# Patient Record
Sex: Male | Born: 1965 | Race: White | Hispanic: No | Marital: Single
Health system: Southern US, Community
[De-identification: ages and names within clinical notes are randomized; demographics above are authoritative.]

## PROBLEM LIST (undated history)

## (undated) DIAGNOSIS — I10 Essential (primary) hypertension: Secondary | ICD-10-CM

## (undated) DIAGNOSIS — S82091A Other fracture of right patella, initial encounter for closed fracture: Secondary | ICD-10-CM

## (undated) DIAGNOSIS — F341 Dysthymic disorder: Secondary | ICD-10-CM

## (undated) HISTORY — DX: Dysthymic disorder: F34.1

## (undated) HISTORY — PX: MANDIBLE FRACTURE SURGERY: SHX706

## (undated) HISTORY — PX: TOE SURGERY: SHX1073

## (undated) HISTORY — DX: Essential (primary) hypertension: I10

---

## 2006-11-22 ENCOUNTER — Ambulatory Visit: Payer: Self-pay | Admitting: Family Medicine

## 2006-12-22 ENCOUNTER — Ambulatory Visit: Payer: Self-pay | Admitting: Family Medicine

## 2007-04-23 ENCOUNTER — Ambulatory Visit: Payer: Self-pay | Admitting: Family Medicine

## 2007-05-14 ENCOUNTER — Ambulatory Visit: Payer: Self-pay | Admitting: Family Medicine

## 2007-05-17 ENCOUNTER — Ambulatory Visit: Payer: Self-pay | Admitting: Internal Medicine

## 2007-05-17 ENCOUNTER — Encounter (INDEPENDENT_AMBULATORY_CARE_PROVIDER_SITE_OTHER): Payer: Self-pay | Admitting: Nurse Practitioner

## 2007-05-17 LAB — CONVERTED CEMR LAB
ALT: 25 units/L (ref 0–53)
Alkaline Phosphatase: 75 units/L (ref 39–117)
Basophils Absolute: 0 10*3/uL (ref 0.0–0.1)
Creatinine, Ser: 1 mg/dL (ref 0.40–1.50)
Eosinophils Absolute: 0.2 10*3/uL (ref 0.0–0.7)
Eosinophils Relative: 2 % (ref 0–5)
Glucose, Bld: 92 mg/dL (ref 70–99)
HCT: 47 % (ref 39.0–52.0)
MCHC: 34.5 g/dL (ref 30.0–36.0)
MCV: 89 fL (ref 78.0–100.0)
Monocytes Absolute: 1 10*3/uL — ABNORMAL HIGH (ref 0.2–0.7)
Platelets: 246 10*3/uL (ref 150–400)
RDW: 13.6 % (ref 11.5–14.0)
Sodium: 139 meq/L (ref 135–145)
Total Bilirubin: 0.6 mg/dL (ref 0.3–1.2)
Total Protein: 7.4 g/dL (ref 6.0–8.3)

## 2007-05-23 ENCOUNTER — Ambulatory Visit: Payer: Self-pay | Admitting: Family Medicine

## 2007-05-23 ENCOUNTER — Encounter (INDEPENDENT_AMBULATORY_CARE_PROVIDER_SITE_OTHER): Payer: Self-pay | Admitting: Nurse Practitioner

## 2007-05-23 LAB — CONVERTED CEMR LAB
HDL: 39 mg/dL — ABNORMAL LOW (ref >39)
Total CHOL/HDL Ratio: 5.4
Triglycerides: 163 mg/dL — ABNORMAL HIGH (ref <150)

## 2007-06-05 ENCOUNTER — Ambulatory Visit: Payer: Self-pay | Admitting: Family Medicine

## 2007-06-12 ENCOUNTER — Ambulatory Visit (HOSPITAL_COMMUNITY): Admission: RE | Admit: 2007-06-12 | Discharge: 2007-06-12 | Payer: Self-pay | Admitting: Nurse Practitioner

## 2007-06-15 ENCOUNTER — Ambulatory Visit: Payer: Self-pay | Admitting: *Deleted

## 2007-11-28 ENCOUNTER — Ambulatory Visit: Payer: Self-pay | Admitting: Family Medicine

## 2007-12-07 ENCOUNTER — Ambulatory Visit: Payer: Self-pay | Admitting: Family Medicine

## 2008-01-04 ENCOUNTER — Ambulatory Visit: Payer: Self-pay | Admitting: Family Medicine

## 2008-02-29 ENCOUNTER — Emergency Department (HOSPITAL_COMMUNITY): Admission: EM | Admit: 2008-02-29 | Discharge: 2008-02-29 | Payer: Self-pay | Admitting: Emergency Medicine

## 2008-03-07 ENCOUNTER — Ambulatory Visit: Payer: Self-pay | Admitting: Family Medicine

## 2009-07-08 ENCOUNTER — Ambulatory Visit: Payer: Self-pay | Admitting: Family Medicine

## 2009-08-06 ENCOUNTER — Ambulatory Visit: Payer: Self-pay | Admitting: Family Medicine

## 2010-01-08 ENCOUNTER — Ambulatory Visit: Payer: Self-pay | Admitting: Family Medicine

## 2010-01-12 ENCOUNTER — Ambulatory Visit: Payer: Self-pay | Admitting: Family Medicine

## 2010-07-28 ENCOUNTER — Ambulatory Visit: Payer: Self-pay | Admitting: Family Medicine

## 2010-08-30 ENCOUNTER — Ambulatory Visit: Payer: Self-pay | Admitting: Family Medicine

## 2011-03-23 ENCOUNTER — Other Ambulatory Visit: Payer: Self-pay | Admitting: Family Medicine

## 2011-03-24 ENCOUNTER — Other Ambulatory Visit: Payer: Self-pay | Admitting: Family Medicine

## 2011-03-24 MED ORDER — PAROXETINE HCL 20 MG PO TABS: 20.0000 mg | ORAL_TABLET | Freq: Every day | ORAL | Status: DC

## 2011-03-28 NOTE — Telephone Encounter (Signed)
Renew the medicine 

## 2011-03-28 NOTE — Telephone Encounter (Signed)
Have you done this?

## 2011-07-25 ENCOUNTER — Encounter: Payer: Self-pay | Admitting: Family Medicine

## 2011-07-25 ENCOUNTER — Ambulatory Visit (INDEPENDENT_AMBULATORY_CARE_PROVIDER_SITE_OTHER): Payer: Medicaid Other | Admitting: Family Medicine

## 2011-07-25 VITALS — BP 130/80 | HR 68 | Wt 188.0 lb

## 2011-07-25 DIAGNOSIS — M7711 Lateral epicondylitis, right elbow: Secondary | ICD-10-CM

## 2011-07-25 DIAGNOSIS — I10 Essential (primary) hypertension: Secondary | ICD-10-CM

## 2011-07-25 DIAGNOSIS — F329 Major depressive disorder, single episode, unspecified: Secondary | ICD-10-CM

## 2011-07-25 DIAGNOSIS — F3289 Other specified depressive episodes: Secondary | ICD-10-CM

## 2011-07-25 DIAGNOSIS — F32A Depression, unspecified: Secondary | ICD-10-CM

## 2011-07-25 DIAGNOSIS — M7710 Lateral epicondylitis, unspecified elbow: Secondary | ICD-10-CM

## 2011-07-25 DIAGNOSIS — Z23 Encounter for immunization: Secondary | ICD-10-CM

## 2011-07-25 MED ORDER — PAROXETINE HCL 20 MG PO TABS: 20.0000 mg | ORAL_TABLET | ORAL | Status: DC
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
## 2011-07-25 NOTE — Patient Instructions (Signed)
Use heat on your elbow and change her wrist position

## 2011-08-21 ENCOUNTER — Other Ambulatory Visit: Payer: Self-pay | Admitting: Family Medicine

## 2011-09-23 ENCOUNTER — Telehealth: Payer: Self-pay | Admitting: Family Medicine

## 2011-09-23 NOTE — Telephone Encounter (Signed)
I CALLED THE PHARMACY AND THE PATIENT DOES HAVE 5 REFILLS LEFT ON HIS PAXIL. I CALLED THE PATIENT AND INFORMED HIM OF THIS INFORMATION. CLS

## 2011-09-23 NOTE — Telephone Encounter (Signed)
Chart shows it was refilled early October with 6 refills per Dr. Susann Givens.  Call pharmacy to verify

## 2012-01-23 ENCOUNTER — Telehealth: Payer: Self-pay | Admitting: Internal Medicine

## 2012-01-23 MED ORDER — LISINOPRIL-HYDROCHLOROTHIAZIDE 10-12.5 MG PO TABS: 1.0000 | ORAL_TABLET | Freq: Every day | ORAL | Status: DC

## 2012-01-23 NOTE — Telephone Encounter (Signed)
Med sent in.

## 2012-05-16 ENCOUNTER — Encounter: Payer: Self-pay | Admitting: Medical

## 2012-05-16 ENCOUNTER — Ambulatory Visit (INDEPENDENT_AMBULATORY_CARE_PROVIDER_SITE_OTHER): Payer: Medicaid Other | Admitting: Medical

## 2012-05-16 VITALS — BP 140/90 | HR 76 | Temp 97.3°F | Resp 16 | Wt 174.0 lb

## 2012-05-16 DIAGNOSIS — F419 Anxiety disorder, unspecified: Secondary | ICD-10-CM

## 2012-05-16 DIAGNOSIS — F411 Generalized anxiety disorder: Secondary | ICD-10-CM

## 2012-05-16 MED ORDER — PAROXETINE HCL 30 MG PO TABS: 30.0000 mg | ORAL_TABLET | ORAL | Status: DC

## 2012-05-16 NOTE — Patient Instructions (Signed)

## 2012-05-16 NOTE — Progress Notes (Signed)
Subjective: Here for anxiety problem.  He reports that he gets nervous in the evening like something is going to happen, although nothing happens to him.  He notes that there is a lot of activity at the house in the evening.  He lives with 4 other adults, roommates.  Its not a group home, but he has lived with one roommate for 18 years.  He notes in the evenings others in the house slam doors, people raising their voice, making noise, and this gets the dogs barking, making even more noise.  This causes him to be on edge, gets nervousness, irritable.  This interferes with him resting and getting to sleep in the evenings.  He sometimes smokes a little marijuana which helps .  He has tried a friend's Valium which really helped calm him down.   He takes Paxil 20mg  daily in the mornings, but he wants medication to calm him down in the evening.   One of his roommates drinks heavily and this irritates him.   He denies any problems with anxiety when in large crowds or out at stores or during the day.  No other c/o.  Objective: Gen: wd, wn, nad Psych: pleasant, good eye contact  Assessment:  Encounter Diagnosis  Name Primary?  Marland Kitchen Anxiety Yes   Discussed his concerns, home situation, ways to deal with stress and anxiety.   Advised he talk to roommates about not slamming doors or making as much noise in the evenings.  Increased his Paxil to 30mg  daily.   Advised against Valium or other sedative type medication at this time.  Recheck 3-4 wk.

## 2012-05-23 ENCOUNTER — Encounter: Payer: Self-pay | Admitting: Internal Medicine

## 2012-05-24 ENCOUNTER — Telehealth: Payer: Self-pay | Admitting: Family Medicine

## 2012-05-24 MED ORDER — LISINOPRIL-HYDROCHLOROTHIAZIDE 10-12.5 MG PO TABS: 1.0000 | ORAL_TABLET | Freq: Every day | ORAL | Status: DC

## 2012-05-24 NOTE — Telephone Encounter (Signed)
sent B/P meds in

## 2012-06-04 ENCOUNTER — Ambulatory Visit (INDEPENDENT_AMBULATORY_CARE_PROVIDER_SITE_OTHER): Payer: Medicaid Other | Admitting: Family Medicine

## 2012-06-04 ENCOUNTER — Encounter: Payer: Self-pay | Admitting: Family Medicine

## 2012-06-04 VITALS — BP 130/86 | HR 58 | Ht 63.0 in | Wt 173.0 lb

## 2012-06-04 DIAGNOSIS — F341 Dysthymic disorder: Secondary | ICD-10-CM

## 2012-06-04 MED ORDER — PAROXETINE HCL 40 MG PO TABS: 40.0000 mg | ORAL_TABLET | ORAL | Status: DC

## 2012-06-04 NOTE — Progress Notes (Signed)
  Subjective:    Patient ID: Stephen Dudley, male    DOB: 06/01/1966, 46 y.o.   MRN: [id number]  HPI He is here for recheck. His Paxil was increased to 30 mg. He states that he is doing much better. He is having less difficulty with mood swings, sleeping better and generally happier. He states that he is not back to his normal self.   Review of Systems     Objective:   Physical Exam Alert and in no distress with appropriate affect       Assessment & Plan:   1. Dysthymia  PARoxetine (PAXIL) 40 MG tablet   I will increase his Paxil to 40 mg. Recheck here in one month.

## 2012-07-09 ENCOUNTER — Ambulatory Visit (INDEPENDENT_AMBULATORY_CARE_PROVIDER_SITE_OTHER): Payer: Medicaid Other | Admitting: Family Medicine

## 2012-07-09 ENCOUNTER — Encounter: Payer: Self-pay | Admitting: Family Medicine

## 2012-07-09 VITALS — BP 120/80 | HR 62 | Wt 175.0 lb

## 2012-07-09 DIAGNOSIS — F329 Major depressive disorder, single episode, unspecified: Secondary | ICD-10-CM

## 2012-07-09 DIAGNOSIS — Z23 Encounter for immunization: Secondary | ICD-10-CM

## 2012-07-09 DIAGNOSIS — F3289 Other specified depressive episodes: Secondary | ICD-10-CM

## 2012-07-09 DIAGNOSIS — F341 Dysthymic disorder: Secondary | ICD-10-CM

## 2012-07-09 DIAGNOSIS — F32A Depression, unspecified: Secondary | ICD-10-CM

## 2012-07-09 MED ORDER — PAROXETINE HCL 40 MG PO TABS: 40.0000 mg | ORAL_TABLET | ORAL | Status: DC

## 2012-07-09 NOTE — Progress Notes (Signed)
  Subjective:    Patient ID: Stephen Dudley, male    DOB: [birth year], 46 y.o.   MRN: [id number]  HPI He is here for recheck. He states that he is more calm and sleeping better. He is having no other troubles with this medication.   Review of Systems     Objective:   Physical Exam Alert and in no distress with appropriate affect       Assessment & Plan:   1. Depression    continue present medication regimen and recheck here in 6 months. Flu shot given. Risks versus benefits were discussed.

## 2012-09-25 ENCOUNTER — Other Ambulatory Visit: Payer: Self-pay | Admitting: Family Medicine

## 2012-10-03 ENCOUNTER — Other Ambulatory Visit: Payer: Self-pay | Admitting: Family Medicine

## 2013-04-09 ENCOUNTER — Other Ambulatory Visit: Payer: Self-pay | Admitting: Family Medicine

## 2013-07-21 ENCOUNTER — Other Ambulatory Visit: Payer: Self-pay | Admitting: Family Medicine

## 2013-07-22 NOTE — Telephone Encounter (Signed)
Is this ok?

## 2013-07-22 NOTE — Telephone Encounter (Signed)
Needs an appointment but did not let her run out.

## 2013-07-26 ENCOUNTER — Other Ambulatory Visit: Payer: Self-pay | Admitting: Family Medicine

## 2013-07-26 ENCOUNTER — Telehealth: Payer: Self-pay | Admitting: Family Medicine

## 2013-07-26 MED ORDER — LISINOPRIL-HYDROCHLOROTHIAZIDE 10-12.5 MG PO TABS: ORAL_TABLET | ORAL | Status: DC

## 2013-07-26 NOTE — Telephone Encounter (Signed)
Please fill pt's bp meds. He is completely out. Pt uses [facility] in [place].

## 2013-07-26 NOTE — Telephone Encounter (Signed)
SENT IN B/P MEDS NEEDS MED CHECK

## 2013-09-20 ENCOUNTER — Other Ambulatory Visit: Payer: Self-pay | Admitting: Family Medicine

## 2013-10-20 ENCOUNTER — Other Ambulatory Visit: Payer: Self-pay | Admitting: Family Medicine

## 2014-01-11 ENCOUNTER — Other Ambulatory Visit: Payer: Self-pay | Admitting: Family Medicine

## 2014-01-13 NOTE — Telephone Encounter (Signed)
He needs a followup visit before I can renew this. It looks like he ran out several months ago

## 2014-01-13 NOTE — Telephone Encounter (Signed)
Is this ok to refill?  

## 2014-01-14 NOTE — Telephone Encounter (Signed)
Pt states he will CB to schedule follow up

## 2014-06-16 ENCOUNTER — Ambulatory Visit (INDEPENDENT_AMBULATORY_CARE_PROVIDER_SITE_OTHER): Payer: Medicaid Other | Admitting: Family Medicine

## 2014-06-16 ENCOUNTER — Encounter: Payer: Self-pay | Admitting: Family Medicine

## 2014-06-16 VITALS — BP 110/76 | HR 67 | Wt 168.0 lb

## 2014-06-16 DIAGNOSIS — Z79899 Other long term (current) drug therapy: Secondary | ICD-10-CM

## 2014-06-16 DIAGNOSIS — I10 Essential (primary) hypertension: Secondary | ICD-10-CM

## 2014-06-16 DIAGNOSIS — Z1211 Encounter for screening for malignant neoplasm of colon: Secondary | ICD-10-CM

## 2014-06-16 DIAGNOSIS — F32A Depression, unspecified: Secondary | ICD-10-CM

## 2014-06-16 DIAGNOSIS — F329 Major depressive disorder, single episode, unspecified: Secondary | ICD-10-CM

## 2014-06-16 DIAGNOSIS — Z23 Encounter for immunization: Secondary | ICD-10-CM

## 2014-06-16 DIAGNOSIS — F3289 Other specified depressive episodes: Secondary | ICD-10-CM

## 2014-06-16 LAB — LIPID PANEL
CHOLESTEROL: 203 mg/dL — AB (ref 0–200)
HDL: 46 mg/dL (ref >39)
LDL Cholesterol: 138 mg/dL — ABNORMAL HIGH (ref 0–99)
TRIGLYCERIDES: 97 mg/dL (ref <150)
Total CHOL/HDL Ratio: 4.4 Ratio
VLDL: 19 mg/dL (ref 0–40)

## 2014-06-16 LAB — CBC WITH DIFFERENTIAL/PLATELET
Basophils Absolute: 0 10*3/uL (ref 0.0–0.1)
Basophils Relative: 0 % (ref 0–1)
Eosinophils Absolute: 0.3 10*3/uL (ref 0.0–0.7)
Eosinophils Relative: 5 % (ref 0–5)
HCT: 42 % (ref 39.0–52.0)
HEMOGLOBIN: 14.8 g/dL (ref 13.0–17.0)
LYMPHS ABS: 1.7 10*3/uL (ref 0.7–4.0)
Lymphocytes Relative: 24 % (ref 12–46)
MCH: 30.3 pg (ref 26.0–34.0)
MCHC: 35.2 g/dL (ref 30.0–36.0)
MCV: 86.1 fL (ref 78.0–100.0)
MONOS PCT: 11 % (ref 3–12)
Monocytes Absolute: 0.8 10*3/uL (ref 0.1–1.0)
NEUTROS ABS: 4.1 10*3/uL (ref 1.7–7.7)
NEUTROS PCT: 60 % (ref 43–77)
Platelets: 235 10*3/uL (ref 150–400)
RBC: 4.88 MIL/uL (ref 4.22–5.81)
RDW: 13.5 % (ref 11.5–15.5)
WBC: 6.9 10*3/uL (ref 4.0–10.5)

## 2014-06-16 LAB — COMPREHENSIVE METABOLIC PANEL
ALBUMIN: 4.6 g/dL (ref 3.5–5.2)
ALT: 17 U/L (ref 0–53)
AST: 27 U/L (ref 0–37)
Alkaline Phosphatase: 61 U/L (ref 39–117)
BUN: 12 mg/dL (ref 6–23)
CALCIUM: 9.2 mg/dL (ref 8.4–10.5)
CO2: 24 meq/L (ref 19–32)
Chloride: 101 mEq/L (ref 96–112)
Creat: 0.91 mg/dL (ref 0.50–1.35)
GLUCOSE: 125 mg/dL — AB (ref 70–99)
POTASSIUM: 4 meq/L (ref 3.5–5.3)
SODIUM: 135 meq/L (ref 135–145)
TOTAL PROTEIN: 6.8 g/dL (ref 6.0–8.3)
Total Bilirubin: 0.5 mg/dL (ref 0.2–1.2)

## 2014-06-16 MED ORDER — PAROXETINE HCL 40 MG PO TABS: ORAL_TABLET | ORAL | Status: DC

## 2014-06-16 MED ORDER — LISINOPRIL-HYDROCHLOROTHIAZIDE 10-12.5 MG PO TABS: 1.0000 | ORAL_TABLET | Freq: Every day | ORAL | Status: DC

## 2014-06-16 NOTE — Progress Notes (Signed)
   Subjective:    Patient ID: Stephen Dudley, male    DOB: [birth year], 48 y.o.   MRN: [id number]  HPI He is here for medication check. He continues on his blood pressure medication and is having no difficulty with that. He also is on 40 mg of Paxil. He has on occasion forgotten dosing and did run into withdrawal symptoms. He now knows to not skip dosing. He is now 48. He has qualified for Medicaid. We have not done any formal evaluation however there is no question that his mental capacities are diminished. He has no concerns or complaints.   Review of Systems     Objective:   Physical Exam alert and in no distress. Tympanic membranes and canals are normal. Throat is clear. Tonsils are normal. Neck is supple without adenopathy or thyromegaly. Cardiac exam shows a regular sinus rhythm without murmurs or gallops. Lungs are clear to auscultation. Abdominal exam shows no masses or tenderness.        Assessment & Plan:  Need for prophylactic vaccination and inoculation against influenza - Plan: Flu Vaccine QUAD 36+ mos IM  Special screening for malignant neoplasms, colon - Plan: Ambulatory referral to Gastroenterology  Essential hypertension - Plan: CBC with Differential, Comprehensive metabolic panel, lisinopril-hydrochlorothiazide (PRINZIDE,ZESTORETIC) 10-12.5 MG per tablet  Depression - Plan: PARoxetine (PAXIL) 40 MG tablet  Encounter for long-term (current) use of other medications - Plan: CBC with Differential, Comprehensive metabolic panel, Lipid panel, lisinopril-hydrochlorothiazide (PRINZIDE,ZESTORETIC) 10-12.5 MG per tablet His immunizations were updated. I will continue on his present medication regimen and set him up for colonoscopy.

## 2014-06-19 ENCOUNTER — Encounter: Payer: Self-pay | Admitting: Internal Medicine

## 2014-08-27 ENCOUNTER — Encounter: Payer: Medicaid Other | Admitting: Internal Medicine

## 2014-11-17 ENCOUNTER — Other Ambulatory Visit: Payer: Self-pay | Admitting: Family Medicine

## 2014-11-18 ENCOUNTER — Other Ambulatory Visit: Payer: Self-pay | Admitting: Family Medicine

## 2015-07-07 ENCOUNTER — Other Ambulatory Visit: Payer: Self-pay | Admitting: Family Medicine

## 2015-07-08 NOTE — Telephone Encounter (Signed)
Is this okay?

## 2015-07-08 NOTE — Telephone Encounter (Signed)
It's time for an appointment for him. Give him enough medicine until his appointment

## 2015-07-23 ENCOUNTER — Ambulatory Visit (INDEPENDENT_AMBULATORY_CARE_PROVIDER_SITE_OTHER): Payer: Medicaid Other | Admitting: Family Medicine

## 2015-07-23 ENCOUNTER — Encounter: Payer: Self-pay | Admitting: Family Medicine

## 2015-07-23 VITALS — BP 132/94 | HR 64 | Resp 12 | Wt 172.4 lb

## 2015-07-23 DIAGNOSIS — S40011A Contusion of right shoulder, initial encounter: Secondary | ICD-10-CM | POA: Diagnosis not present

## 2015-07-23 DIAGNOSIS — Z1211 Encounter for screening for malignant neoplasm of colon: Secondary | ICD-10-CM

## 2015-07-23 DIAGNOSIS — F329 Major depressive disorder, single episode, unspecified: Secondary | ICD-10-CM | POA: Diagnosis not present

## 2015-07-23 DIAGNOSIS — Z23 Encounter for immunization: Secondary | ICD-10-CM

## 2015-07-23 DIAGNOSIS — I10 Essential (primary) hypertension: Secondary | ICD-10-CM

## 2015-07-23 DIAGNOSIS — F32A Depression, unspecified: Secondary | ICD-10-CM

## 2015-07-23 MED ORDER — LISINOPRIL-HYDROCHLOROTHIAZIDE 10-12.5 MG PO TABS: 1.0000 | ORAL_TABLET | Freq: Every day | ORAL | Status: DC

## 2015-07-23 MED ORDER — PAROXETINE HCL 40 MG PO TABS: 40.0000 mg | ORAL_TABLET | Freq: Every evening | ORAL | Status: DC
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
## 2015-09-18 ENCOUNTER — Other Ambulatory Visit: Payer: Self-pay | Admitting: Family Medicine

## 2015-09-21 NOTE — Telephone Encounter (Signed)
Is this okay?

## 2016-03-15 ENCOUNTER — Encounter: Payer: Self-pay | Admitting: Family Medicine

## 2016-03-15 ENCOUNTER — Ambulatory Visit (INDEPENDENT_AMBULATORY_CARE_PROVIDER_SITE_OTHER): Payer: Medicaid Other | Admitting: Family Medicine

## 2016-03-15 DIAGNOSIS — W57XXXA Bitten or stung by nonvenomous insect and other nonvenomous arthropods, initial encounter: Secondary | ICD-10-CM

## 2016-03-15 DIAGNOSIS — T148 Other injury of unspecified body region: Secondary | ICD-10-CM

## 2016-03-15 DIAGNOSIS — L03818 Cellulitis of other sites: Secondary | ICD-10-CM | POA: Diagnosis not present

## 2016-03-15 MED ORDER — DOXYCYCLINE HYCLATE 100 MG PO TABS: 100.0000 mg | ORAL_TABLET | Freq: Two times a day (BID) | ORAL | Status: DC

## 2016-03-15 NOTE — Progress Notes (Signed)
Subjective:     Patient ID: Stephen Dudley, male   DOB: 02/23/1966, 50 y.o.   MRN: [id number]  HPI Stephen Dudley presents to clinic with painful swelling on his penis after a tick bite in that area 2 days ago.  He removed a small, non-engorged tick a few hours after going fishing in the area on Sunday and the swelling and constant throbbing / burning pain started yesterday.  He denies fever, chills, night sweats, muscle pain, joint pain, rash in any other area, dysuria, hematuria, and change in urinary frequency.    Review of Systems     Objective:   Physical Exam Alert and in no distress.  Cardiac exam shows a regular sinus rhythm without murmurs or gallops. Lungs are clear to auscultation. Swollen, erythematous region on head  of the penis.  No inguinal LAD.       Assessment / Plan:     Tick bite  Cellulitis of other specified site - Plan: doxycycline (VIBRA-TABS) 100 MG tablet  Tick was removed within hours and he has no systemic symptoms therefore I do not think this is a tic related infection.  Will treat with 7 day course of doxycycline 100 mg BID for cellulitis.

## 2016-07-25 ENCOUNTER — Other Ambulatory Visit: Payer: Self-pay | Admitting: Family Medicine

## 2016-07-25 DIAGNOSIS — F329 Major depressive disorder, single episode, unspecified: Secondary | ICD-10-CM

## 2016-07-25 DIAGNOSIS — F32A Depression, unspecified: Secondary | ICD-10-CM

## 2016-07-25 NOTE — Telephone Encounter (Signed)
Is this okay to refill? 

## 2016-09-30 ENCOUNTER — Other Ambulatory Visit: Payer: Self-pay | Admitting: Family Medicine

## 2016-09-30 DIAGNOSIS — I10 Essential (primary) hypertension: Secondary | ICD-10-CM

## 2016-09-30 NOTE — Telephone Encounter (Signed)
Pt coming in on Thursday 12/14 for med check. Has enough med til then

## 2016-10-06 ENCOUNTER — Other Ambulatory Visit: Payer: Self-pay

## 2016-10-06 ENCOUNTER — Encounter: Payer: Self-pay | Admitting: Family Medicine

## 2016-10-06 ENCOUNTER — Ambulatory Visit (INDEPENDENT_AMBULATORY_CARE_PROVIDER_SITE_OTHER): Payer: Medicaid Other | Admitting: Family Medicine

## 2016-10-06 VITALS — BP 146/100 | HR 85 | Ht 63.0 in | Wt 167.0 lb

## 2016-10-06 DIAGNOSIS — F32A Depression, unspecified: Secondary | ICD-10-CM

## 2016-10-06 DIAGNOSIS — T50905A Adverse effect of unspecified drugs, medicaments and biological substances, initial encounter: Secondary | ICD-10-CM

## 2016-10-06 DIAGNOSIS — Z79899 Other long term (current) drug therapy: Secondary | ICD-10-CM

## 2016-10-06 DIAGNOSIS — F17200 Nicotine dependence, unspecified, uncomplicated: Secondary | ICD-10-CM | POA: Diagnosis not present

## 2016-10-06 DIAGNOSIS — Z23 Encounter for immunization: Secondary | ICD-10-CM | POA: Diagnosis not present

## 2016-10-06 DIAGNOSIS — T887XXA Unspecified adverse effect of drug or medicament, initial encounter: Secondary | ICD-10-CM

## 2016-10-06 DIAGNOSIS — Z1159 Encounter for screening for other viral diseases: Secondary | ICD-10-CM | POA: Diagnosis not present

## 2016-10-06 DIAGNOSIS — I10 Essential (primary) hypertension: Secondary | ICD-10-CM | POA: Diagnosis not present

## 2016-10-06 DIAGNOSIS — F329 Major depressive disorder, single episode, unspecified: Secondary | ICD-10-CM

## 2016-10-06 DIAGNOSIS — Z1211 Encounter for screening for malignant neoplasm of colon: Secondary | ICD-10-CM

## 2016-10-06 LAB — CBC WITH DIFFERENTIAL/PLATELET
BASOS PCT: 0 %
Basophils Absolute: 0 cells/uL (ref 0–200)
EOS PCT: 2 %
Eosinophils Absolute: 154 cells/uL (ref 15–500)
HCT: 46 % (ref 38.5–50.0)
HEMOGLOBIN: 15.5 g/dL (ref 13.2–17.1)
LYMPHS ABS: 1617 {cells}/uL (ref 850–3900)
Lymphocytes Relative: 21 %
MCH: 30.3 pg (ref 27.0–33.0)
MCHC: 33.7 g/dL (ref 32.0–36.0)
MCV: 89.8 fL (ref 80.0–100.0)
MONOS PCT: 10 %
MPV: 10.4 fL (ref 7.5–12.5)
Monocytes Absolute: 770 cells/uL (ref 200–950)
NEUTROS ABS: 5159 {cells}/uL (ref 1500–7800)
Neutrophils Relative %: 67 %
PLATELETS: 247 10*3/uL (ref 140–400)
RBC: 5.12 MIL/uL (ref 4.20–5.80)
RDW: 14 % (ref 11.0–15.0)
WBC: 7.7 10*3/uL (ref 4.0–10.5)

## 2016-10-06 LAB — COMPREHENSIVE METABOLIC PANEL
ALBUMIN: 4.3 g/dL (ref 3.6–5.1)
ALT: 16 U/L (ref 9–46)
AST: 20 U/L (ref 10–35)
Alkaline Phosphatase: 49 U/L (ref 40–115)
BILIRUBIN TOTAL: 0.5 mg/dL (ref 0.2–1.2)
BUN: 14 mg/dL (ref 7–25)
CHLORIDE: 107 mmol/L (ref 98–110)
CO2: 29 mmol/L (ref 20–31)
Calcium: 9.1 mg/dL (ref 8.6–10.3)
Creat: 0.8 mg/dL (ref 0.70–1.33)
Glucose, Bld: 94 mg/dL (ref 65–99)
Potassium: 4.6 mmol/L (ref 3.5–5.3)
SODIUM: 140 mmol/L (ref 135–146)
TOTAL PROTEIN: 6.5 g/dL (ref 6.1–8.1)

## 2016-10-06 LAB — LIPID PANEL
CHOL/HDL RATIO: 3.7 ratio (ref <5.0)
CHOLESTEROL: 182 mg/dL (ref <200)
HDL: 49 mg/dL (ref >40)
LDL Cholesterol: 116 mg/dL — ABNORMAL HIGH (ref <100)
Triglycerides: 85 mg/dL (ref <150)
VLDL: 17 mg/dL (ref <30)

## 2016-10-06 MED ORDER — PAROXETINE HCL 40 MG PO TABS: 40.0000 mg | ORAL_TABLET | Freq: Every evening | ORAL | 3 refills | Status: DC

## 2016-10-06 MED ORDER — LISINOPRIL-HYDROCHLOROTHIAZIDE 20-12.5 MG PO TABS: 1.0000 | ORAL_TABLET | Freq: Every day | ORAL | 3 refills | Status: DC

## 2016-10-06 NOTE — Progress Notes (Signed)
   Subjective:    Patient ID: Stephen RiggerDavid L Dudley, male    DOB: 03/06/1966, 50 y.o.   MRN: [id number]  HPI He is here for an interval evaluation. He continues on Paxil but does note that for the last several weeks he has had increased difficulty with intermittent dizziness, malaise, myalgia, headaches, vision. Prior to this he did get a refill on his Paxil however in the last 3 days he has stopped the medication. He continues on his lisinopril/HCTZ. He is supposed to get a colonoscopy but has not set that up yet. He continues to smoke He has no other concerns or complaints.   Review of Systems     Objective:   Physical Exam Alert and in no distress. Tympanic membranes and canals are normal. Pharyngeal area is normal. Neck is supple without adenopathy or thyromegaly. Cardiac exam shows a regular sinus rhythm without murmurs or gallops. Lungs are clear to auscultation.        Assessment & Plan:  Screening for colon cancer  Essential hypertension - Plan: lisinopril-hydrochlorothiazide (ZESTORETIC) 20-12.5 MG tablet, CBC with Differential/Platelet, Comprehensive metabolic panel  Depression, unspecified depression type - Plan: DISCONTINUED: PARoxetine (PAXIL) 40 MG tablet  Need for hepatitis C screening test - Plan: Hepatitis C antibody  Need for prophylactic vaccination and inoculation against influenza - Plan: Flu Vaccine QUAD 36+ mos IM  Need for prophylactic vaccination with combined diphtheria-tetanus-pertussis (DTP) vaccine - Plan: Tdap vaccine greater than or equal to 7yo IM  Encounter for long-term (current) use of medications - Plan: CBC with Differential/Platelet, Comprehensive metabolic panel, Lipid panel  Adverse effect of drug, initial encounter - Plan: CBC with Differential/Platelet, Comprehensive metabolic panel, Lipid panel His symptoms are most consistent with possible drug withdrawal however discussion with the pharmacy indicates the pill came from the same manufacturer. Did  recommend that they switch to a different brand as this seems to be the most likely cause of his present symptoms. I will also increase his Zestoretic to 20/12.5. He is to return here in one month for recheck on that. His immunizations were updated. He is not interested in quitting smoking.

## 2016-10-06 NOTE — Telephone Encounter (Signed)
I have sent in for name brand only due to manf. And side affects gen. Was giving him

## 2016-10-07 ENCOUNTER — Encounter: Payer: Self-pay | Admitting: Internal Medicine

## 2016-10-07 LAB — HEPATITIS C ANTIBODY: HCV AB: NEGATIVE

## 2016-10-24 ENCOUNTER — Telehealth: Payer: Self-pay | Admitting: Family Medicine

## 2016-10-24 NOTE — Telephone Encounter (Signed)
Recv'd fax from pharmacy that Medicaid will not cover brand name PAXIL

## 2016-11-04 NOTE — Telephone Encounter (Signed)
Called Medicaid t# [phone] and initiated P.A. For brand name Paxil, under clinical review and will have to call back on Monday to see if approved

## 2016-11-07 NOTE — Telephone Encounter (Signed)
See if they will allow us to give the generic of Paxil and if so do it

## 2016-11-07 NOTE — Telephone Encounter (Signed)
P.A. [place] Rampenied for Brand Paxil until pt has trial of either Citalopram, Escitalopram, Sertraline, Fluoxetine, Fluvoxamine.  Do you want to switch?

## 2016-11-08 NOTE — Telephone Encounter (Signed)
Let him know situation and we will go from there. The only other issue would be to switch which means he'll need to come in so we can discuss that

## 2016-11-08 NOTE — Telephone Encounter (Addendum)
Yes generic Paxil is covered, but per notes, issue was that pt was having side effects from generic and wanted to be on Brand and insurance will not cover brand Paxil.  So listed is the covered alternatives

## 2016-11-11 ENCOUNTER — Ambulatory Visit: Payer: Medicaid Other | Admitting: Family Medicine
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
## 2016-11-15 ENCOUNTER — Ambulatory Visit (INDEPENDENT_AMBULATORY_CARE_PROVIDER_SITE_OTHER): Payer: Medicaid Other | Admitting: Family Medicine

## 2016-11-15 ENCOUNTER — Encounter: Payer: Self-pay | Admitting: Family Medicine

## 2016-11-15 VITALS — BP 100/70 | HR 70 | Wt 171.0 lb

## 2016-11-15 DIAGNOSIS — F329 Major depressive disorder, single episode, unspecified: Secondary | ICD-10-CM

## 2016-11-15 DIAGNOSIS — I951 Orthostatic hypotension: Secondary | ICD-10-CM | POA: Diagnosis not present

## 2016-11-15 DIAGNOSIS — F32A Depression, unspecified: Secondary | ICD-10-CM

## 2016-11-15 MED ORDER — PAROXETINE HCL 40 MG PO TABS: 40.0000 mg | ORAL_TABLET | Freq: Every evening | ORAL | 3 refills | Status: DC

## 2016-11-15 NOTE — Progress Notes (Signed)
   Subjective:    Patient ID: Stephen RiggerDavid L Dudley, male    DOB: 11/26/1965, 51 y.o.   MRN: [id number]  HPI He is having trouble getting branded Paxil. His insurance will not pay for that. He would like to try different drugstore to see if he can get a generic product that works better. He also describes one episode of getting up quickly and becoming dizzy and falling.   Review of Systems     Objective:   Physical Exam Alert and in no distress otherwise not examined. His affect is appropriate.       Assessment & Plan:  Depression, unspecified depression type - Plan: PARoxetine (PAXIL) 40 MG tablet  Postural hypotension I will call in generic Paxil to a different drugstore. Also explained that he is describing postural hypotension and encouraged him to get up more slowly from lying or sitting to standing.

## 2016-11-16 ENCOUNTER — Telehealth: Payer: Self-pay | Admitting: Family Medicine

## 2016-11-16 DIAGNOSIS — F329 Major depressive disorder, single episode, unspecified: Secondary | ICD-10-CM

## 2016-11-16 DIAGNOSIS — F32A Depression, unspecified: Secondary | ICD-10-CM

## 2016-11-16 MED ORDER — PAROXETINE HCL 40 MG PO TABS: 40.0000 mg | ORAL_TABLET | Freq: Every evening | ORAL | 3 refills | Status: DC

## 2016-11-16 NOTE — Telephone Encounter (Signed)
Please call [facility] pharmacy does not take medicaid so he needs rx generic prilosec [facility] [address]

## 2017-12-30 ENCOUNTER — Other Ambulatory Visit: Payer: Self-pay | Admitting: Family Medicine

## 2017-12-30 DIAGNOSIS — I10 Essential (primary) hypertension: Secondary | ICD-10-CM

## 2018-01-01 ENCOUNTER — Telehealth: Payer: Self-pay

## 2018-01-01 NOTE — Telephone Encounter (Signed)
Called pt to schedule a med check. No answer and pt cell phone is off will try home number.

## 2018-02-04 ENCOUNTER — Other Ambulatory Visit: Payer: Self-pay | Admitting: Family Medicine

## 2018-02-04 DIAGNOSIS — I10 Essential (primary) hypertension: Secondary | ICD-10-CM

## 2018-03-05 ENCOUNTER — Encounter: Payer: Self-pay | Admitting: Family Medicine

## 2018-03-15 ENCOUNTER — Encounter: Payer: Self-pay | Admitting: Family Medicine

## 2018-03-20 ENCOUNTER — Other Ambulatory Visit: Payer: Self-pay | Admitting: Family Medicine

## 2018-03-20 ENCOUNTER — Other Ambulatory Visit: Payer: Self-pay

## 2018-03-20 ENCOUNTER — Telehealth: Payer: Self-pay | Admitting: Family Medicine

## 2018-03-20 DIAGNOSIS — I10 Essential (primary) hypertension: Secondary | ICD-10-CM

## 2018-03-20 DIAGNOSIS — F329 Major depressive disorder, single episode, unspecified: Secondary | ICD-10-CM

## 2018-03-20 DIAGNOSIS — F32A Depression, unspecified: Secondary | ICD-10-CM

## 2018-03-20 MED ORDER — LISINOPRIL-HYDROCHLOROTHIAZIDE 20-12.5 MG PO TABS: 1.0000 | ORAL_TABLET | Freq: Every day | ORAL | 0 refills | Status: DC

## 2018-03-20 MED ORDER — PAROXETINE HCL 40 MG PO TABS
40.0000 mg | ORAL_TABLET | Freq: Every evening | ORAL | 3 refills | Status: DC
Start: 2018-03-20 — End: 2018-04-19

## 2018-03-20 NOTE — Telephone Encounter (Signed)
Stephen Dudley called and is requesting a refill for the RX's Paxil and Lisinopril would like them sent to the [id number] - [place], [place] - [address] AT [facility] OF [address] pt has a medcheck  appt 04/19/2018

## 2018-04-09 ENCOUNTER — Telehealth: Payer: Self-pay | Admitting: Family Medicine

## 2018-04-09 NOTE — Telephone Encounter (Signed)
[facility]'d [facility] from [facility] that Brand Paxil needs P.A., called pharmacy and they already got approval and processed as generic and pt picked up already

## 2018-04-19 ENCOUNTER — Ambulatory Visit: Payer: Medicaid Other | Admitting: Family Medicine

## 2018-04-19 ENCOUNTER — Encounter: Payer: Self-pay | Admitting: Family Medicine

## 2018-04-19 VITALS — BP 128/86 | HR 66 | Temp 98.0°F | Wt 159.6 lb

## 2018-04-19 DIAGNOSIS — F329 Major depressive disorder, single episode, unspecified: Secondary | ICD-10-CM | POA: Diagnosis not present

## 2018-04-19 DIAGNOSIS — F32A Depression, unspecified: Secondary | ICD-10-CM

## 2018-04-19 DIAGNOSIS — Z1211 Encounter for screening for malignant neoplasm of colon: Secondary | ICD-10-CM | POA: Diagnosis not present

## 2018-04-19 DIAGNOSIS — F17200 Nicotine dependence, unspecified, uncomplicated: Secondary | ICD-10-CM

## 2018-04-19 DIAGNOSIS — I10 Essential (primary) hypertension: Secondary | ICD-10-CM

## 2018-04-19 DIAGNOSIS — Z13220 Encounter for screening for lipoid disorders: Secondary | ICD-10-CM

## 2018-04-19 LAB — COMPREHENSIVE METABOLIC PANEL
A/G RATIO: 1.8 (ref 1.2–2.2)
ALT: 20 IU/L (ref 0–44)
AST: 25 IU/L (ref 0–40)
Albumin: 4.4 g/dL (ref 3.5–5.5)
Alkaline Phosphatase: 85 IU/L (ref 39–117)
BUN/Creatinine Ratio: 11 (ref 9–20)
BUN: 10 mg/dL (ref 6–24)
Bilirubin Total: 0.3 mg/dL (ref 0.0–1.2)
CO2: 24 mmol/L (ref 20–29)
CREATININE: 0.87 mg/dL (ref 0.76–1.27)
Calcium: 9 mg/dL (ref 8.7–10.2)
Chloride: 103 mmol/L (ref 96–106)
GFR, EST AFRICAN AMERICAN: 114 mL/min/{1.73_m2} (ref >59)
GFR, EST NON AFRICAN AMERICAN: 99 mL/min/{1.73_m2} (ref >59)
GLUCOSE: 88 mg/dL (ref 65–99)
Globulin, Total: 2.4 g/dL (ref 1.5–4.5)
POTASSIUM: 4.7 mmol/L (ref 3.5–5.2)
Sodium: 141 mmol/L (ref 134–144)
Total Protein: 6.8 g/dL (ref 6.0–8.5)

## 2018-04-19 LAB — LIPID PANEL
CHOL/HDL RATIO: 3.9 ratio (ref 0.0–5.0)
Cholesterol, Total: 193 mg/dL (ref 100–199)
HDL: 49 mg/dL (ref >39)
LDL Calculated: 132 mg/dL — ABNORMAL HIGH (ref 0–99)
TRIGLYCERIDES: 62 mg/dL (ref 0–149)
VLDL CHOLESTEROL CAL: 12 mg/dL (ref 5–40)

## 2018-04-19 LAB — CBC WITH DIFFERENTIAL/PLATELET
BASOS ABS: 0 10*3/uL (ref 0.0–0.2)
Basos: 0 %
EOS (ABSOLUTE): 0.2 10*3/uL (ref 0.0–0.4)
Eos: 2 %
HEMOGLOBIN: 15.1 g/dL (ref 13.0–17.7)
Hematocrit: 44.8 % (ref 37.5–51.0)
Immature Grans (Abs): 0 10*3/uL (ref 0.0–0.1)
Immature Granulocytes: 0 %
LYMPHS ABS: 1.8 10*3/uL (ref 0.7–3.1)
Lymphs: 23 %
MCH: 30.5 pg (ref 26.6–33.0)
MCHC: 33.7 g/dL (ref 31.5–35.7)
MCV: 91 fL (ref 79–97)
MONOCYTES: 10 %
MONOS ABS: 0.8 10*3/uL (ref 0.1–0.9)
NEUTROS ABS: 5.2 10*3/uL (ref 1.4–7.0)
Neutrophils: 65 %
Platelets: 238 10*3/uL (ref 150–450)
RBC: 4.95 x10E6/uL (ref 4.14–5.80)
RDW: 14.1 % (ref 12.3–15.4)
WBC: 8 10*3/uL (ref 3.4–10.8)

## 2018-04-19 MED ORDER — LISINOPRIL-HYDROCHLOROTHIAZIDE 20-12.5 MG PO TABS: 1.0000 | ORAL_TABLET | Freq: Every day | ORAL | 3 refills | Status: DC

## 2018-04-19 MED ORDER — PAROXETINE HCL 40 MG PO TABS: 40.0000 mg | ORAL_TABLET | Freq: Every evening | ORAL | 3 refills | Status: DC

## 2018-04-19 NOTE — Progress Notes (Signed)
   Subjective:    Patient ID: Stephen RiggerDavid L Dudley, male    DOB: 01/01/1966, 52 y.o.   MRN: [id number]  HPI He is here for an interval evaluation.  He continues to smoke roughly 5 cigarettes/day and is not interested in quitting.  Continues on lisinopril for his blood pressure.  He also is taking Paxil and has been on this and quite stable psychologically for quite some time.  Recently his roommate did die.  He seems to be handling this well and is grieving appropriately.  He has no other concerns or questions.   Review of Systems     Objective:   Physical Exam Alert and in no distress. Tympanic membranes and canals are normal. Pharyngeal area is normal. Neck is supple without adenopathy or thyromegaly. Cardiac exam shows a regular sinus rhythm without murmurs or gallops. Lungs are clear to auscultation.        Assessment & Plan:  Depression, unspecified depression type - Plan: PARoxetine (PAXIL) 40 MG tablet  Screening for colon cancer - Plan: Cologuard  Essential hypertension - Plan: CBC with Differential/Platelet, Comprehensive metabolic panel, lisinopril-hydrochlorothiazide (PRINZIDE,ZESTORETIC) 20-12.5 MG tablet  Current smoker  Screening for lipid disorders - Plan: Lipid panel Presently he needs no more immunizations.  Encouraged him to quit smoking entirely although I do not think he will do that.  His meds were renewed.

## 2018-05-03 ENCOUNTER — Encounter: Payer: Self-pay | Admitting: Family Medicine

## 2018-06-18 ENCOUNTER — Telehealth: Payer: Self-pay

## 2018-06-18 NOTE — Telephone Encounter (Signed)
Called pt to advise that [facility] has not gotten his cologuard sample. Please advise pt to send in sample. KH

## 2018-06-20 ENCOUNTER — Telehealth: Payer: Self-pay

## 2018-06-20 ENCOUNTER — Encounter: Payer: Self-pay | Admitting: Family Medicine

## 2018-06-20 NOTE — Telephone Encounter (Signed)
Pt was advise to take cologuard . KH

## 2018-07-05 ENCOUNTER — Other Ambulatory Visit: Payer: Self-pay | Admitting: Family Medicine

## 2018-07-05 DIAGNOSIS — I10 Essential (primary) hypertension: Secondary | ICD-10-CM

## 2018-11-06 ENCOUNTER — Other Ambulatory Visit: Payer: Self-pay | Admitting: Family Medicine

## 2018-11-06 DIAGNOSIS — I10 Essential (primary) hypertension: Secondary | ICD-10-CM

## 2019-03-02 ENCOUNTER — Other Ambulatory Visit: Payer: Self-pay | Admitting: Family Medicine

## 2019-03-02 DIAGNOSIS — I10 Essential (primary) hypertension: Secondary | ICD-10-CM

## 2019-03-04 ENCOUNTER — Other Ambulatory Visit: Payer: Self-pay | Admitting: Family Medicine

## 2019-03-04 DIAGNOSIS — I10 Essential (primary) hypertension: Secondary | ICD-10-CM

## 2019-03-04 NOTE — Telephone Encounter (Signed)
LVM for pt to call office to make an appointment for a med check. KH

## 2019-03-05 ENCOUNTER — Other Ambulatory Visit: Payer: Self-pay

## 2019-03-05 DIAGNOSIS — I10 Essential (primary) hypertension: Secondary | ICD-10-CM

## 2019-03-05 MED ORDER — LISINOPRIL-HYDROCHLOROTHIAZIDE 20-12.5 MG PO TABS: 1.0000 | ORAL_TABLET | Freq: Every day | ORAL | 0 refills | Status: DC

## 2019-03-05 NOTE — Telephone Encounter (Signed)
Called pt who advised me to call Richard to make him an appointment. Meds were sent in for 30 dsys to cover pt until he comes in the office. KH

## 2019-04-19 LAB — COLOGUARD

## 2019-04-22 ENCOUNTER — Other Ambulatory Visit: Payer: Self-pay

## 2019-04-22 DIAGNOSIS — Z1211 Encounter for screening for malignant neoplasm of colon: Secondary | ICD-10-CM

## 2019-04-25 ENCOUNTER — Other Ambulatory Visit: Payer: Self-pay | Admitting: Family Medicine

## 2019-04-25 DIAGNOSIS — I10 Essential (primary) hypertension: Secondary | ICD-10-CM

## 2019-04-25 NOTE — Telephone Encounter (Signed)
Left message for pt to call back and schedule an appt as he is over due

## 2019-04-25 NOTE — Telephone Encounter (Signed)
Once he schedules we can send in for 30 days

## 2019-04-30 ENCOUNTER — Telehealth: Payer: Self-pay

## 2019-04-30 DIAGNOSIS — I10 Essential (primary) hypertension: Secondary | ICD-10-CM

## 2019-04-30 MED ORDER — LISINOPRIL-HYDROCHLOROTHIAZIDE 20-12.5 MG PO TABS: 1.0000 | ORAL_TABLET | Freq: Every day | ORAL | 0 refills | Status: DC

## 2019-04-30 NOTE — Telephone Encounter (Signed)
Pt has scheduled appt for 05/08/19. 30 day supply of Lisinopril is being sent to the pharmacy.

## 2019-05-08 ENCOUNTER — Other Ambulatory Visit: Payer: Self-pay

## 2019-05-08 ENCOUNTER — Ambulatory Visit: Payer: Medicaid Other | Admitting: Family Medicine

## 2019-05-08 ENCOUNTER — Encounter: Payer: Self-pay | Admitting: Family Medicine

## 2019-05-08 VITALS — BP 164/98 | HR 64 | Temp 97.8°F | Wt 166.8 lb

## 2019-05-08 DIAGNOSIS — Z560 Unemployment, unspecified: Secondary | ICD-10-CM | POA: Diagnosis not present

## 2019-05-08 DIAGNOSIS — I10 Essential (primary) hypertension: Secondary | ICD-10-CM | POA: Diagnosis not present

## 2019-05-08 DIAGNOSIS — F17200 Nicotine dependence, unspecified, uncomplicated: Secondary | ICD-10-CM | POA: Diagnosis not present

## 2019-05-08 DIAGNOSIS — F329 Major depressive disorder, single episode, unspecified: Secondary | ICD-10-CM

## 2019-05-08 DIAGNOSIS — F32A Depression, unspecified: Secondary | ICD-10-CM

## 2019-05-08 MED ORDER — PAROXETINE HCL 20 MG PO TABS: 20.0000 mg | ORAL_TABLET | Freq: Every day | ORAL | 1 refills | Status: DC

## 2019-05-08 MED ORDER — LISINOPRIL-HYDROCHLOROTHIAZIDE 20-12.5 MG PO TABS: 1.0000 | ORAL_TABLET | Freq: Every day | ORAL | 0 refills | Status: DC

## 2019-05-08 NOTE — Progress Notes (Signed)
   Subjective:    Patient ID: Stephen Dudley, male    DOB: 1966/03/09, 53 y.o.   MRN: [id number]  HPI He is here for a med check appointment.  He does take lisinopril/HCTZ.  He is also continuing on Paxil.  When asked how he is doing psychologically he did not give me a good solid answer.  He then stated that he is smoking marijuana on a daily basis.  He asked for Valium and I explained that I could not give that to him since he is smoking marijuana.  He has no other concerns or complaints.  Social and family history as well as health maintenance and immunizations was reviewed  Review of Systems     Objective:   Physical Exam Alert and in no distress. Tympanic membranes and canals are normal. Pharyngeal area is normal. Neck is supple without adenopathy or thyromegaly. Cardiac exam shows a regular sinus rhythm without murmurs or gallops. Lungs are clear to auscultation.        Assessment & Plan:  Depression, unspecified depression type - Plan: PARoxetine (PAXIL) 20 MG tablet, I think it is reasonable to cut down on his Paxil and see how he tolerates this.  He seems to be doing fairly well psychologically and also since he is smoking marijuana, I think it is reasonable to cut back on his Paxil.  Essential hypertension - Plan: CBC with Differential/Platelet, Comprehensive metabolic panel, lisinopril-hydrochlorothiazide (ZESTORETIC) 20-12.5 MG tablet,   Not currently working due to disabled status -   Current smoker - Plan: I strongly encouraged him to stop smoking marijuana.

## 2019-05-09 ENCOUNTER — Other Ambulatory Visit: Payer: Self-pay

## 2019-05-09 DIAGNOSIS — I10 Essential (primary) hypertension: Secondary | ICD-10-CM

## 2019-05-09 LAB — CBC WITH DIFFERENTIAL/PLATELET
Basophils Absolute: 0.1 10*3/uL (ref 0.0–0.2)
Basos: 1 %
EOS (ABSOLUTE): 0.2 10*3/uL (ref 0.0–0.4)
Eos: 2 %
Hematocrit: 45.1 % (ref 37.5–51.0)
Hemoglobin: 15.8 g/dL (ref 13.0–17.7)
Immature Grans (Abs): 0 10*3/uL (ref 0.0–0.1)
Immature Granulocytes: 0 %
Lymphocytes Absolute: 1.8 10*3/uL (ref 0.7–3.1)
Lymphs: 22 %
MCH: 30.6 pg (ref 26.6–33.0)
MCHC: 35 g/dL (ref 31.5–35.7)
MCV: 87 fL (ref 79–97)
Monocytes Absolute: 0.7 10*3/uL (ref 0.1–0.9)
Monocytes: 8 %
Neutrophils Absolute: 5.4 10*3/uL (ref 1.4–7.0)
Neutrophils: 67 %
Platelets: 248 10*3/uL (ref 150–450)
RBC: 5.17 x10E6/uL (ref 4.14–5.80)
RDW: 13 % (ref 11.6–15.4)
WBC: 8.2 10*3/uL (ref 3.4–10.8)

## 2019-05-09 LAB — COMPREHENSIVE METABOLIC PANEL
ALT: 13 IU/L (ref 0–44)
AST: 19 IU/L (ref 0–40)
Albumin/Globulin Ratio: 2.1 (ref 1.2–2.2)
Albumin: 4.6 g/dL (ref 3.8–4.9)
Alkaline Phosphatase: 57 IU/L (ref 39–117)
BUN/Creatinine Ratio: 9 (ref 9–20)
BUN: 8 mg/dL (ref 6–24)
Bilirubin Total: 0.4 mg/dL (ref 0.0–1.2)
CO2: 25 mmol/L (ref 20–29)
Calcium: 9.4 mg/dL (ref 8.7–10.2)
Chloride: 104 mmol/L (ref 96–106)
Creatinine, Ser: 0.9 mg/dL (ref 0.76–1.27)
GFR calc Af Amer: 112 mL/min/{1.73_m2} (ref >59)
GFR calc non Af Amer: 96 mL/min/{1.73_m2} (ref >59)
Globulin, Total: 2.2 g/dL (ref 1.5–4.5)
Glucose: 94 mg/dL (ref 65–99)
Potassium: 4.4 mmol/L (ref 3.5–5.2)
Sodium: 143 mmol/L (ref 134–144)
Total Protein: 6.8 g/dL (ref 6.0–8.5)

## 2019-05-09 MED ORDER — LISINOPRIL-HYDROCHLOROTHIAZIDE 20-12.5 MG PO TABS: 1.0000 | ORAL_TABLET | Freq: Every day | ORAL | 0 refills | Status: DC

## 2019-05-10 ENCOUNTER — Other Ambulatory Visit: Payer: Self-pay | Admitting: Internal Medicine

## 2019-05-10 DIAGNOSIS — I10 Essential (primary) hypertension: Secondary | ICD-10-CM

## 2019-05-10 MED ORDER — LISINOPRIL-HYDROCHLOROTHIAZIDE 20-12.5 MG PO TABS: 1.0000 | ORAL_TABLET | Freq: Every day | ORAL | 0 refills | Status: DC

## 2019-05-13 ENCOUNTER — Other Ambulatory Visit: Payer: Self-pay

## 2019-05-13 ENCOUNTER — Telehealth: Payer: Self-pay | Admitting: Family Medicine

## 2019-05-13 DIAGNOSIS — I10 Essential (primary) hypertension: Secondary | ICD-10-CM

## 2019-05-13 DIAGNOSIS — F32A Depression, unspecified: Secondary | ICD-10-CM

## 2019-05-13 DIAGNOSIS — F329 Major depressive disorder, single episode, unspecified: Secondary | ICD-10-CM

## 2019-05-13 MED ORDER — LISINOPRIL-HYDROCHLOROTHIAZIDE 20-12.5 MG PO TABS: 1.0000 | ORAL_TABLET | Freq: Every day | ORAL | 0 refills | Status: DC

## 2019-05-13 MED ORDER — LISINOPRIL-HYDROCHLOROTHIAZIDE 20-12.5 MG PO TABS: 1.0000 | ORAL_TABLET | Freq: Every day | ORAL | 3 refills | Status: DC

## 2019-05-13 MED ORDER — PAROXETINE HCL 20 MG PO TABS: 20.0000 mg | ORAL_TABLET | Freq: Every day | ORAL | 3 refills | Status: DC

## 2019-05-13 NOTE — Telephone Encounter (Signed)
Stephen Dudley called and left VM stating he needs Stephen Dudley RX poaxil and lisinopril hctz called in to the [place] AT [address] and would like 90 days with refills Stephen Dudley can be reached at [phone]

## 2019-05-13 NOTE — Telephone Encounter (Signed)
Pt needs [facility] Please advise [facility]

## 2020-04-09 ENCOUNTER — Emergency Department (HOSPITAL_COMMUNITY): Payer: Medicaid Other

## 2020-04-09 ENCOUNTER — Emergency Department (HOSPITAL_COMMUNITY)
Admission: EM | Admit: 2020-04-09 | Discharge: 2020-04-09 | Disposition: A | Discharge status: Home / Self Care | Payer: Medicaid Other | Attending: Emergency Medicine | Admitting: Emergency Medicine

## 2020-04-09 DIAGNOSIS — M549 Dorsalgia, unspecified: Secondary | ICD-10-CM | POA: Insufficient documentation

## 2020-04-09 DIAGNOSIS — S3992XA Unspecified injury of lower back, initial encounter: Secondary | ICD-10-CM | POA: Diagnosis not present

## 2020-04-09 DIAGNOSIS — Y999 Unspecified external cause status: Secondary | ICD-10-CM | POA: Diagnosis not present

## 2020-04-09 DIAGNOSIS — S80811A Abrasion, right lower leg, initial encounter: Secondary | ICD-10-CM | POA: Diagnosis not present

## 2020-04-09 DIAGNOSIS — S82031A Displaced transverse fracture of right patella, initial encounter for closed fracture: Secondary | ICD-10-CM | POA: Insufficient documentation

## 2020-04-09 DIAGNOSIS — S299XXA Unspecified injury of thorax, initial encounter: Secondary | ICD-10-CM | POA: Diagnosis not present

## 2020-04-09 DIAGNOSIS — Y939 Activity, unspecified: Secondary | ICD-10-CM | POA: Diagnosis not present

## 2020-04-09 DIAGNOSIS — Z79899 Other long term (current) drug therapy: Secondary | ICD-10-CM | POA: Diagnosis not present

## 2020-04-09 DIAGNOSIS — Y929 Unspecified place or not applicable: Secondary | ICD-10-CM | POA: Insufficient documentation

## 2020-04-09 DIAGNOSIS — S82041A Displaced comminuted fracture of right patella, initial encounter for closed fracture: Secondary | ICD-10-CM | POA: Diagnosis not present

## 2020-04-09 DIAGNOSIS — S3993XA Unspecified injury of pelvis, initial encounter: Secondary | ICD-10-CM | POA: Diagnosis not present

## 2020-04-09 DIAGNOSIS — R52 Pain, unspecified: Secondary | ICD-10-CM | POA: Diagnosis not present

## 2020-04-09 DIAGNOSIS — S8991XA Unspecified injury of right lower leg, initial encounter: Secondary | ICD-10-CM | POA: Diagnosis present

## 2020-04-09 DIAGNOSIS — S199XXA Unspecified injury of neck, initial encounter: Secondary | ICD-10-CM | POA: Diagnosis not present

## 2020-04-09 LAB — PROTIME-INR
INR: 1 (ref 0.8–1.2)
Prothrombin Time: 12.8 seconds (ref 11.4–15.2)

## 2020-04-09 LAB — CBC
HCT: 47.6 % (ref 39.0–52.0)
Hemoglobin: 15.6 g/dL (ref 13.0–17.0)
MCH: 30.6 pg (ref 26.0–34.0)
MCHC: 32.8 g/dL (ref 30.0–36.0)
MCV: 93.3 fL (ref 80.0–100.0)
Platelets: 320 10*3/uL (ref 150–400)
RBC: 5.1 MIL/uL (ref 4.22–5.81)
RDW: 12.6 % (ref 11.5–15.5)
WBC: 15.4 10*3/uL — ABNORMAL HIGH (ref 4.0–10.5)
nRBC: 0 % (ref 0.0–0.2)

## 2020-04-09 LAB — COMPREHENSIVE METABOLIC PANEL
ALT: 21 U/L (ref 0–44)
AST: 32 U/L (ref 15–41)
Albumin: 4.4 g/dL (ref 3.5–5.0)
Alkaline Phosphatase: 57 U/L (ref 38–126)
Anion gap: 9 (ref 5–15)
BUN: 12 mg/dL (ref 6–20)
CO2: 26 mmol/L (ref 22–32)
Calcium: 9.1 mg/dL (ref 8.9–10.3)
Chloride: 106 mmol/L (ref 98–111)
Creatinine, Ser: 1.16 mg/dL (ref 0.61–1.24)
GFR calc Af Amer: >60 mL/min (ref >60)
GFR calc non Af Amer: >60 mL/min (ref >60)
Glucose, Bld: 130 mg/dL — ABNORMAL HIGH (ref 70–99)
Potassium: 3.7 mmol/L (ref 3.5–5.1)
Sodium: 141 mmol/L (ref 135–145)
Total Bilirubin: 0.6 mg/dL (ref 0.3–1.2)
Total Protein: 6.8 g/dL (ref 6.5–8.1)

## 2020-04-09 LAB — I-STAT CHEM 8, ED
BUN: 13 mg/dL (ref 6–20)
Calcium, Ion: 1.17 mmol/L (ref 1.15–1.40)
Chloride: 102 mmol/L (ref 98–111)
Creatinine, Ser: 1.1 mg/dL (ref 0.61–1.24)
Glucose, Bld: 142 mg/dL — ABNORMAL HIGH (ref 70–99)
HCT: 45 % (ref 39.0–52.0)
Hemoglobin: 15.3 g/dL (ref 13.0–17.0)
Potassium: 3.4 mmol/L — ABNORMAL LOW (ref 3.5–5.1)
Sodium: 140 mmol/L (ref 135–145)
TCO2: 28 mmol/L (ref 22–32)

## 2020-04-09 LAB — SAMPLE TO BLOOD BANK

## 2020-04-09 LAB — ETHANOL: Alcohol, Ethyl (B): <10 mg/dL (ref <10)

## 2020-04-09 LAB — LACTIC ACID, PLASMA: Lactic Acid, Venous: 1.4 mmol/L (ref 0.5–1.9)

## 2020-04-09 MED ORDER — HYDROCODONE-ACETAMINOPHEN 5-325 MG PO TABS: 1.0000 | ORAL_TABLET | Freq: Four times a day (QID) | ORAL | 0 refills | Status: DC | PRN

## 2020-04-09 MED ORDER — HYDROCODONE-ACETAMINOPHEN 5-325 MG PO TABS
1.0000 | ORAL_TABLET | Freq: Once | ORAL | Status: AC
  Administered 2020-04-09: 1 via ORAL
  Filled 2020-04-09: qty 1

## 2020-04-09 NOTE — ED Notes (Signed)
Pt returned from xray
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
## 2020-04-09 NOTE — Progress Notes (Signed)
Orthopedic Tech Progress Note Patient Details:  Stephen Dudley 20-Jun-1966 [id number]  Ortho Devices Type of Ortho Device: Knee Immobilizer Ortho Device/Splint Location: Lower right extremity Ortho Device/Splint Interventions: Ordered, Application, Adjustment   Post Interventions Patient Tolerated: Well Instructions Provided: Adjustment of device, Poper ambulation with device, Care of device   Hai Grabe P Harle Stanford 04/09/2020, 7:12 PM

## 2020-04-09 NOTE — Progress Notes (Signed)
Orthopedic Tech Progress Note Patient Details:  Stephen Dudley January 09, 1966 [id number]  Ortho Devices Type of Ortho Device: Crutches Ortho Device/Splint Location: Lower right extremity Ortho Device/Splint Interventions: Adjustment   Post Interventions Patient Tolerated: Well Instructions Provided: Adjustment of device, Poper ambulation with device   Stephen Dudley E Barnard Sharps 04/09/2020, 9:36 PM

## 2020-04-09 NOTE — Consult Note (Signed)
Responded to page, pt unavailable, no family present, staff will page again if further need of chaplain services.  Rev. Freada Twersky Chaplain 
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
## 2020-04-10 DIAGNOSIS — S82031A Displaced transverse fracture of right patella, initial encounter for closed fracture: Secondary | ICD-10-CM | POA: Diagnosis not present

## 2020-04-13 ENCOUNTER — Encounter (HOSPITAL_BASED_OUTPATIENT_CLINIC_OR_DEPARTMENT_OTHER): Payer: Self-pay | Admitting: Orthopedic Surgery

## 2020-04-13 ENCOUNTER — Other Ambulatory Visit: Payer: Self-pay

## 2020-04-13 NOTE — Progress Notes (Signed)
Spoke w/ via phone for pre-op interview---pt and friend richard per pt request Lab needs dos----I stat 8 and ekg             COVID test ------04-17-2020@855  am Arrive at -------1100 am 04-21-2020 No food after midnight, clear liquids until 1000 am then npo Medications to take morning of surgery -----paxil Diabetic medication -----n/a Patient Special Instructions -----none Pre-Op special Istructions -----none Patient verbalized understanding of instructions that were given at this phone interview. Patient denies shortness of breath, chest pain, fever, cough a this phone interview.

## 2020-04-17 ENCOUNTER — Other Ambulatory Visit (HOSPITAL_COMMUNITY)
Admission: RE | Admit: 2020-04-17 | Discharge: 2020-04-17 | Disposition: A | Discharge status: Home / Self Care | Payer: Medicaid Other | Source: Ambulatory Visit | Attending: Orthopedic Surgery | Admitting: Orthopedic Surgery

## 2020-04-17 ENCOUNTER — Other Ambulatory Visit (HOSPITAL_COMMUNITY): Payer: Self-pay

## 2020-04-17 DIAGNOSIS — Z20822 Contact with and (suspected) exposure to covid-19: Secondary | ICD-10-CM | POA: Diagnosis not present

## 2020-04-17 DIAGNOSIS — Z01812 Encounter for preprocedural laboratory examination: Secondary | ICD-10-CM | POA: Diagnosis not present

## 2020-04-17 LAB — SARS CORONAVIRUS 2 (TAT 6-24 HRS): SARS Coronavirus 2: NEGATIVE

## 2020-04-21 ENCOUNTER — Other Ambulatory Visit: Payer: Self-pay

## 2020-04-21 ENCOUNTER — Ambulatory Visit (HOSPITAL_BASED_OUTPATIENT_CLINIC_OR_DEPARTMENT_OTHER): Payer: Medicaid Other | Admitting: Anesthesiology

## 2020-04-21 ENCOUNTER — Encounter (HOSPITAL_BASED_OUTPATIENT_CLINIC_OR_DEPARTMENT_OTHER)
Admission: RE | Disposition: A | Discharge status: Home / Self Care | Payer: Self-pay | Source: Home / Self Care | Attending: Orthopedic Surgery

## 2020-04-21 ENCOUNTER — Encounter (HOSPITAL_BASED_OUTPATIENT_CLINIC_OR_DEPARTMENT_OTHER): Payer: Self-pay | Admitting: Orthopedic Surgery

## 2020-04-21 ENCOUNTER — Ambulatory Visit (HOSPITAL_BASED_OUTPATIENT_CLINIC_OR_DEPARTMENT_OTHER)
Admission: RE | Admit: 2020-04-21 | Discharge: 2020-04-21 | Disposition: A | Discharge status: Home / Self Care | Payer: Medicaid Other | Attending: Orthopedic Surgery | Admitting: Orthopedic Surgery

## 2020-04-21 DIAGNOSIS — Z880 Allergy status to penicillin: Secondary | ICD-10-CM | POA: Insufficient documentation

## 2020-04-21 DIAGNOSIS — G8918 Other acute postprocedural pain: Secondary | ICD-10-CM | POA: Diagnosis not present

## 2020-04-21 DIAGNOSIS — S82001A Unspecified fracture of right patella, initial encounter for closed fracture: Secondary | ICD-10-CM | POA: Diagnosis not present

## 2020-04-21 DIAGNOSIS — D649 Anemia, unspecified: Secondary | ICD-10-CM | POA: Diagnosis not present

## 2020-04-21 DIAGNOSIS — Y939 Activity, unspecified: Secondary | ICD-10-CM | POA: Insufficient documentation

## 2020-04-21 DIAGNOSIS — Z791 Long term (current) use of non-steroidal anti-inflammatories (NSAID): Secondary | ICD-10-CM | POA: Diagnosis not present

## 2020-04-21 DIAGNOSIS — F1210 Cannabis abuse, uncomplicated: Secondary | ICD-10-CM | POA: Diagnosis not present

## 2020-04-21 DIAGNOSIS — I10 Essential (primary) hypertension: Secondary | ICD-10-CM | POA: Insufficient documentation

## 2020-04-21 DIAGNOSIS — F329 Major depressive disorder, single episode, unspecified: Secondary | ICD-10-CM | POA: Insufficient documentation

## 2020-04-21 DIAGNOSIS — F17200 Nicotine dependence, unspecified, uncomplicated: Secondary | ICD-10-CM | POA: Diagnosis not present

## 2020-04-21 DIAGNOSIS — F341 Dysthymic disorder: Secondary | ICD-10-CM | POA: Diagnosis not present

## 2020-04-21 DIAGNOSIS — Z885 Allergy status to narcotic agent status: Secondary | ICD-10-CM | POA: Insufficient documentation

## 2020-04-21 DIAGNOSIS — S82041A Displaced comminuted fracture of right patella, initial encounter for closed fracture: Secondary | ICD-10-CM | POA: Diagnosis present

## 2020-04-21 DIAGNOSIS — X58XXXA Exposure to other specified factors, initial encounter: Secondary | ICD-10-CM | POA: Diagnosis not present

## 2020-04-21 DIAGNOSIS — M23601 Other spontaneous disruption of unspecified ligament of right knee: Secondary | ICD-10-CM | POA: Diagnosis not present

## 2020-04-21 DIAGNOSIS — Z79899 Other long term (current) drug therapy: Secondary | ICD-10-CM | POA: Diagnosis not present

## 2020-04-21 HISTORY — DX: Other fracture of right patella, initial encounter for closed fracture: S82.091A

## 2020-04-21 HISTORY — PX: ORIF PATELLA: SHX5033

## 2020-04-21 LAB — POCT I-STAT, CHEM 8
BUN: 10 mg/dL (ref 6–20)
Calcium, Ion: 1.2 mmol/L (ref 1.15–1.40)
Chloride: 100 mmol/L (ref 98–111)
Creatinine, Ser: 0.8 mg/dL (ref 0.61–1.24)
Glucose, Bld: 109 mg/dL — ABNORMAL HIGH (ref 70–99)
HCT: 37 % — ABNORMAL LOW (ref 39.0–52.0)
Hemoglobin: 12.6 g/dL — ABNORMAL LOW (ref 13.0–17.0)
Potassium: 3.9 mmol/L (ref 3.5–5.1)
Sodium: 140 mmol/L (ref 135–145)
TCO2: 29 mmol/L (ref 22–32)

## 2020-04-21 SURGERY — OPEN REDUCTION INTERNAL FIXATION (ORIF) PATELLA
Anesthesia: Regional | Site: Knee | Laterality: Right

## 2020-04-21 MED ORDER — CEFAZOLIN SODIUM-DEXTROSE 2-4 GM/100ML-% IV SOLN
2.0000 g | INTRAVENOUS | Status: AC
  Administered 2020-04-21: 2 g via INTRAVENOUS

## 2020-04-21 MED ORDER — LIDOCAINE 2% (20 MG/ML) 5 ML SYRINGE
INTRAMUSCULAR | Status: AC
  Filled 2020-04-21: qty 5

## 2020-04-21 MED ORDER — ASPIRIN EC 81 MG PO TBEC
81.0000 mg | DELAYED_RELEASE_TABLET | Freq: Two times a day (BID) | ORAL | 3 refills | Status: AC
Start: 2020-04-21 — End: 2021-04-21

## 2020-04-21 MED ORDER — MIDAZOLAM HCL 2 MG/2ML IJ SOLN
2.0000 mg | Freq: Once | INTRAMUSCULAR | Status: AC
  Administered 2020-04-21: 2 mg via INTRAVENOUS

## 2020-04-21 MED ORDER — ONDANSETRON HCL 4 MG/2ML IJ SOLN: 4.0000 mg | Freq: Once | INTRAMUSCULAR | Status: DC | PRN

## 2020-04-21 MED ORDER — FENTANYL CITRATE (PF) 100 MCG/2ML IJ SOLN
INTRAMUSCULAR | Status: AC
  Filled 2020-04-21: qty 2

## 2020-04-21 MED ORDER — LACTATED RINGERS IV SOLN: INTRAVENOUS | Status: DC

## 2020-04-21 MED ORDER — ACETAMINOPHEN 500 MG PO TABS
1000.0000 mg | ORAL_TABLET | Freq: Once | ORAL | Status: AC
  Administered 2020-04-21: 1000 mg via ORAL

## 2020-04-21 MED ORDER — SUGAMMADEX SODIUM 200 MG/2ML IV SOLN
INTRAVENOUS | Status: DC | PRN
  Administered 2020-04-21: 200 mg via INTRAVENOUS

## 2020-04-21 MED ORDER — FENTANYL CITRATE (PF) 100 MCG/2ML IJ SOLN: 25.0000 ug | INTRAMUSCULAR | Status: DC | PRN

## 2020-04-21 MED ORDER — PROPOFOL 10 MG/ML IV BOLUS
INTRAVENOUS | Status: AC
  Filled 2020-04-21: qty 20

## 2020-04-21 MED ORDER — ROCURONIUM BROMIDE 10 MG/ML (PF) SYRINGE
PREFILLED_SYRINGE | INTRAVENOUS | Status: AC
  Filled 2020-04-21: qty 10

## 2020-04-21 MED ORDER — CEFAZOLIN SODIUM-DEXTROSE 2-4 GM/100ML-% IV SOLN
INTRAVENOUS | Status: AC
  Filled 2020-04-21: qty 100

## 2020-04-21 MED ORDER — ROCURONIUM BROMIDE 10 MG/ML (PF) SYRINGE
PREFILLED_SYRINGE | INTRAVENOUS | Status: DC | PRN
  Administered 2020-04-21: 60 mg via INTRAVENOUS

## 2020-04-21 MED ORDER — ACETAMINOPHEN 500 MG PO TABS
ORAL_TABLET | ORAL | Status: AC
  Filled 2020-04-21: qty 2

## 2020-04-21 MED ORDER — OXYCODONE HCL 5 MG PO TABS
5.0000 mg | ORAL_TABLET | Freq: Once | ORAL | Status: AC
  Administered 2020-04-21: 5 mg via ORAL

## 2020-04-21 MED ORDER — ONDANSETRON HCL 4 MG/2ML IJ SOLN
INTRAMUSCULAR | Status: AC
  Filled 2020-04-21: qty 2

## 2020-04-21 MED ORDER — ONDANSETRON HCL 4 MG/2ML IJ SOLN
INTRAMUSCULAR | Status: DC | PRN
  Administered 2020-04-21: 4 mg via INTRAVENOUS

## 2020-04-21 MED ORDER — CHLORHEXIDINE GLUCONATE 4 % EX LIQD: 60.0000 mL | Freq: Once | CUTANEOUS | Status: DC

## 2020-04-21 MED ORDER — MIDAZOLAM HCL 2 MG/2ML IJ SOLN
INTRAMUSCULAR | Status: AC
  Filled 2020-04-21: qty 2

## 2020-04-21 MED ORDER — DEXAMETHASONE SODIUM PHOSPHATE 10 MG/ML IJ SOLN
INTRAMUSCULAR | Status: AC
  Filled 2020-04-21: qty 1

## 2020-04-21 MED ORDER — FENTANYL CITRATE (PF) 250 MCG/5ML IJ SOLN
INTRAMUSCULAR | Status: DC | PRN
  Administered 2020-04-21 (×3): 50 ug via INTRAVENOUS
  Administered 2020-04-21 (×2): 25 ug via INTRAVENOUS

## 2020-04-21 MED ORDER — DEXAMETHASONE SODIUM PHOSPHATE 10 MG/ML IJ SOLN
INTRAMUSCULAR | Status: DC | PRN
  Administered 2020-04-21: 10 mg via INTRAVENOUS

## 2020-04-21 MED ORDER — CLONIDINE HCL (ANALGESIA) 100 MCG/ML EP SOLN
EPIDURAL | Status: DC | PRN
  Administered 2020-04-21: 100 ug

## 2020-04-21 MED ORDER — OXYCODONE HCL 5 MG PO TABS
ORAL_TABLET | ORAL | Status: AC
  Filled 2020-04-21: qty 1

## 2020-04-21 MED ORDER — OXYCODONE-ACETAMINOPHEN 5-325 MG PO TABS: 1.0000 | ORAL_TABLET | Freq: Four times a day (QID) | ORAL | 0 refills | Status: AC | PRN

## 2020-04-21 MED ORDER — ROPIVACAINE HCL 5 MG/ML IJ SOLN
INTRAMUSCULAR | Status: DC | PRN
Start: 2020-04-21 — End: 2020-04-21
  Administered 2020-04-21: 30 mL via PERINEURAL

## 2020-04-21 MED ORDER — LIDOCAINE 2% (20 MG/ML) 5 ML SYRINGE
INTRAMUSCULAR | Status: DC | PRN
  Administered 2020-04-21: 75 mg via INTRAVENOUS

## 2020-04-21 MED ORDER — KETOROLAC TROMETHAMINE 15 MG/ML IJ SOLN: 15.0000 mg | Freq: Once | INTRAMUSCULAR | Status: DC | PRN

## 2020-04-21 MED ORDER — PROPOFOL 10 MG/ML IV BOLUS
INTRAVENOUS | Status: DC | PRN
  Administered 2020-04-21: 150 mg via INTRAVENOUS
  Administered 2020-04-21: 50 mg via INTRAVENOUS

## 2020-04-21 SURGICAL SUPPLY — 80 items
APL PRP STRL LF DISP 70% ISPRP (MISCELLANEOUS) ×1
BANDAGE ESMARK 6X9 LF (GAUZE/BANDAGES/DRESSINGS) ×1 IMPLANT
BIT DRILL 2.6 CANN (BIT) ×1 IMPLANT
BLADE SURG 10 STRL SS (BLADE) ×2 IMPLANT
BLADE SURG 15 STRL LF DISP TIS (BLADE) ×2 IMPLANT
BLADE SURG 15 STRL SS (BLADE) ×4
BNDG CMPR 9X6 STRL LF SNTH (GAUZE/BANDAGES/DRESSINGS) ×1
BNDG COHESIVE 4X5 TAN STRL (GAUZE/BANDAGES/DRESSINGS) IMPLANT
BNDG ELASTIC 4X5.8 VLCR STR LF (GAUZE/BANDAGES/DRESSINGS) ×2 IMPLANT
BNDG ELASTIC 6X5.8 VLCR STR LF (GAUZE/BANDAGES/DRESSINGS) ×1 IMPLANT
BNDG ESMARK 6X9 LF (GAUZE/BANDAGES/DRESSINGS) ×2
CHLORAPREP W/TINT 26 (MISCELLANEOUS) ×2 IMPLANT
COVER SURGICAL LIGHT HANDLE (MISCELLANEOUS) ×1 IMPLANT
COVER WAND RF STERILE (DRAPES) ×2 IMPLANT
CUFF TOURN SGL QUICK 24 (TOURNIQUET CUFF)
CUFF TOURN SGL QUICK 34 (TOURNIQUET CUFF) ×2
CUFF TRNQT CYL 24X4X16.5-23 (TOURNIQUET CUFF) IMPLANT
CUFF TRNQT CYL 34X4.125X (TOURNIQUET CUFF) IMPLANT
DECANTER SPIKE VIAL GLASS SM (MISCELLANEOUS) IMPLANT
DRAPE C-ARM 42X72 X-RAY (DRAPES) ×2 IMPLANT
DRAPE C-ARMOR (DRAPES) ×1 IMPLANT
DRAPE EXTREMITY T 121X128X90 (DISPOSABLE) ×2 IMPLANT
DRAPE INCISE IOBAN 66X45 STRL (DRAPES) ×1 IMPLANT
DRAPE OEC MINIVIEW 54X84 (DRAPES) ×2 IMPLANT
DRAPE SHEET LG 3/4 BI-LAMINATE (DRAPES) ×1 IMPLANT
DRAPE U-SHAPE 47X51 STRL (DRAPES) IMPLANT
DRSG EMULSION OIL 3X3 NADH (GAUZE/BANDAGES/DRESSINGS) ×1 IMPLANT
DRSG PAD ABDOMINAL 8X10 ST (GAUZE/BANDAGES/DRESSINGS) ×1 IMPLANT
ELECT REM PT RETURN 9FT ADLT (ELECTROSURGICAL) ×2
ELECTRODE REM PT RTRN 9FT ADLT (ELECTROSURGICAL) ×1 IMPLANT
FIBERTAPE 2 W/STRL NDL 17 (SUTURE) ×1 IMPLANT
GAUZE SPONGE 4X4 12PLY STRL (GAUZE/BANDAGES/DRESSINGS) ×2 IMPLANT
GLOVE BIO SURGEON STRL SZ7.5 (GLOVE) ×4 IMPLANT
GLOVE BIOGEL PI IND STRL 7.0 (GLOVE) IMPLANT
GLOVE BIOGEL PI IND STRL 7.5 (GLOVE) IMPLANT
GLOVE BIOGEL PI IND STRL 8 (GLOVE) ×2 IMPLANT
GLOVE BIOGEL PI INDICATOR 7.0 (GLOVE) ×1
GLOVE BIOGEL PI INDICATOR 7.5 (GLOVE) ×2
GLOVE BIOGEL PI INDICATOR 8 (GLOVE) ×2
GLOVE SURG SS PI 7.5 STRL IVOR (GLOVE) ×1 IMPLANT
GOWN STRL REUS W/ TWL LRG LVL3 (GOWN DISPOSABLE) ×4 IMPLANT
GOWN STRL REUS W/ TWL XL LVL3 (GOWN DISPOSABLE) ×1 IMPLANT
GOWN STRL REUS W/TWL LRG LVL3 (GOWN DISPOSABLE) ×8
GOWN STRL REUS W/TWL XL LVL3 (GOWN DISPOSABLE) ×2
GUIDEWIRE 1.35MM  DUAL TROCAR (WIRE) ×4
GUIDEWIRE 1.35MM DUAL TROCAR (WIRE) IMPLANT
IMMOBILIZER KNEE 22 UNIV (SOFTGOODS) IMPLANT
IMMOBILIZER KNEE 24 THIGH 36 (MISCELLANEOUS) IMPLANT
IMMOBILIZER KNEE 24 UNIV (MISCELLANEOUS) ×2
NEEDLE HYPO 22GX1.5 SAFETY (NEEDLE) IMPLANT
NS IRRIG 1000ML POUR BTL (IV SOLUTION) ×2 IMPLANT
PACK DSU ARTHROSCOPY (CUSTOM PROCEDURE TRAY) IMPLANT
PAD CAST 4YDX4 CTTN HI CHSV (CAST SUPPLIES) ×1 IMPLANT
PADDING CAST ABS 6INX4YD NS (CAST SUPPLIES) ×2
PADDING CAST ABS COTTON 6X4 NS (CAST SUPPLIES) IMPLANT
PADDING CAST COTTON 4X4 STRL (CAST SUPPLIES) ×2
PENCIL BUTTON HOLSTER BLD 10FT (ELECTRODE) ×2 IMPLANT
SCREW CANN BLUNT TIP 4X38 LP (Screw) ×1 IMPLANT
SCREW CANN BLUNT TIP LP 4X46 (Screw) ×1 IMPLANT
SET BASIN DAY SURGERY F.S. (CUSTOM PROCEDURE TRAY) ×2 IMPLANT
SLEEVE SCD COMPRESS KNEE MED (MISCELLANEOUS) IMPLANT
SPLINT FAST PLASTER 5X30 (CAST SUPPLIES)
SPLINT PLASTER CAST FAST 5X30 (CAST SUPPLIES) IMPLANT
SPONGE LAP 4X18 RFD (DISPOSABLE) ×2 IMPLANT
STRIP CLOSURE SKIN 1/2X4 (GAUZE/BANDAGES/DRESSINGS) ×2 IMPLANT
SUCTION FRAZIER HANDLE 10FR (MISCELLANEOUS)
SUCTION TUBE FRAZIER 10FR DISP (MISCELLANEOUS) IMPLANT
SUT ETHILON 3 0 PS 1 (SUTURE) IMPLANT
SUT MON AB 2-0 CT1 36 (SUTURE) ×2 IMPLANT
SUT MON AB 4-0 PC3 18 (SUTURE) IMPLANT
SUT VIC AB 0 CT1 27 (SUTURE) ×4
SUT VIC AB 0 CT1 27XBRD ANBCTR (SUTURE) ×2 IMPLANT
SUT VIC AB 0 SH 27 (SUTURE) IMPLANT
SUT VIC AB 2-0 SH 27 (SUTURE)
SUT VIC AB 2-0 SH 27XBRD (SUTURE) IMPLANT
SYR BULB EAR ULCER 3OZ GRN STR (SYRINGE) ×2 IMPLANT
TOWEL OR NON WOVEN STRL DISP B (DISPOSABLE) ×2 IMPLANT
TUBE CONNECTING 12X1/4 (SUCTIONS) IMPLANT
UNDERPAD 30X30 (UNDERPADS AND DIAPERS) ×2 IMPLANT
YANKAUER SUCT BULB TIP NO VENT (SUCTIONS) ×2 IMPLANT

## 2020-04-21 NOTE — Discharge Instructions (Signed)

## 2020-04-21 NOTE — Anesthesia Procedure Notes (Signed)
Procedure Name: Intubation Date/Time: 04/21/2020 11:13 AM Performed by: Elyn Peers, CRNA Pre-anesthesia Checklist: Patient identified, Emergency Drugs available, Suction available, Patient being monitored and Timeout performed Patient Re-evaluated:Patient Re-evaluated prior to induction Oxygen Delivery Method: Circle system utilized Preoxygenation: Pre-oxygenation with 100% oxygen Induction Type: IV induction Laryngoscope Size: Miller and 3 Grade View: Grade I Tube type: Oral Tube size: 7.0 mm Number of attempts: 1 Airway Equipment and Method: Stylet Placement Confirmation: ETT inserted through vocal cords under direct vision,  positive ETCO2 and breath sounds checked- equal and bilateral Secured at: 24 cm Tube secured with: Tape Dental Injury: Teeth and Oropharynx as per pre-operative assessment  Comments: Poor dentition and loose teeth noted prior to intubation.    All teeth intact with no change in condition post intubation.

## 2020-04-21 NOTE — Op Note (Signed)
04/21/2020  11:05 AM  PATIENT:  Stephen Dudley    PRE-OPERATIVE DIAGNOSIS:  RIGHT PATELLA FRACTURE  POST-OPERATIVE DIAGNOSIS:  Same  PROCEDURE:  OPEN REDUCTION INTERNAL (ORIF) FIXATION PATELLA  SURGEON:  Sheral Apley, MD  PHYSICIAN ASSISTANT: Skip Mayer, PA-C, he was present and scrubbed throughout the case, critical for completion in a timely fashion, and for retraction, instrumentation, and closure.   ANESTHESIA:   General  PREOPERATIVE INDICATIONS:  [name] is a  54 y.o. male with a diagnosis of RIGHT PATELLA FRACTURE who elected for surgical management in order to restore the function of the extensor mechanism.    The risks benefits and alternatives were discussed with the patient preoperatively including but not limited to the risks of infection, bleeding, nerve injury, cardiopulmonary complications, the need for revision surgery, hardware prominence, hardware failure, the need for hardware removal, nonunion, malunion, posttraumatic arthritis, stiffness, loss of strength and function, among others, and the patient was willing to proceed.  OPERATIVE IMPLANTS: 4.0 mm cannulated screws x2 with a total of 2 #2 FiberWire going through the cannulated screws in a figure-of-eight cerclage fashion  OPERATIVE FINDINGS: Displaced patella fracture  OPERATIVE PROCEDURE: The patient was brought to the operating room and placed in the supine position. General anesthesia was administered. IV antibiotics were given. The lower extremity was prepped and draped in usual sterile fashion. The leg was elevated and exsanguinated and the tourniquet was inflated. Time out was performed.   Anterior incision was made over the patella and the fracture fragments identified and cleaned of hematoma. The retinaculum was torn on either side.  I reduced the fracture anatomically and held provisionally with a clamp and placed 2 guidewires for the cannulated screws.  The lengths were measured, after  being confirmed on C-arm, and then I placed the screws, taking care to make sure that there were threads only on the proximal segment, providing compression at the fracture site, and the tips were not prominent proximally.  C-arm used to confirm reduction and position of the screws, and once I was satisfied with this I then used a Keith needle through the screws bringing a total of 2 #2 FiberWire in a figure-of-eight type fashion. This provided excellent secondary fixation. I left the screw lengths slightly short of the far cortex in order to minimize the risk for rupture of the FiberWire over the tip of the screws.  The wounds were irrigated copiously.  I performed a repair of the collateral joint capsul and extensor retinaculum that had ruptured on the medial side. I was happy with this closure.   I repaired the collateral joint capsul and retinaculum on the lateral side. I was happy with this apposition as well  I used Vicryl for the subcutaneous tissue with Steri-Strips and sterile gauze for the skin. The wounds were also injected. A knee immobilizer was applied. The patient was awakened and returned to the PACU in stable and satisfactory condition. There were no complications.   POSTOPERATIVE PLAN: WBAT in knee immobilizer, DVT px: Mobilize and ASA

## 2020-04-21 NOTE — Transfer of Care (Signed)
Immediate Anesthesia Transfer of Care Note  Patient: Stephen Dudley  Procedure(s) Performed: OPEN REDUCTION INTERNAL (ORIF) FIXATION PATELLA (Right Knee)  Patient Location: PACU  Anesthesia Type:General  Level of Consciousness: awake and sedated  Airway & Oxygen Therapy: Patient Spontanous Breathing and Patient connected to face mask oxygen  Post-op Assessment: Report given to RN and Post -op Vital signs reviewed and stable  Post vital signs: Reviewed and stable  Last Vitals:  Vitals Value Taken Time  BP 105/67 04/21/20 1238  Temp    Pulse 59 04/21/20 1242  Resp 15 04/21/20 1242  SpO2 100 % 04/21/20 1242  Vitals shown include unvalidated device data.  Last Pain:  Vitals:   04/21/20 0855  TempSrc: Oral         Complications: No complications documented.

## 2020-04-21 NOTE — Anesthesia Procedure Notes (Addendum)
Anesthesia Regional Block: Femoral nerve block   Pre-Anesthetic Checklist: ,, timeout performed, Correct Patient, Correct Site, Correct Laterality, Correct Procedure,, site marked, risks and benefits discussed, Surgical consent,  Pre-op evaluation,  At surgeon's request and post-op pain management  Laterality: Right  Prep: chloraprep       Needles:  Injection technique: Single-shot  Needle Type: Echogenic Stimulator Needle     Needle Length: 10cm  Needle Gauge: 20     Additional Needles:   Procedures:,,,, ultrasound used (permanent image in chart),,,,  Narrative:  Start time: 04/21/2020 10:10 AM End time: 04/21/2020 10:20 AM Injection made incrementally with aspirations every 5 mL.  Performed by: Personally  Anesthesiologist: Leonides Grills, MD  Additional Notes: Functioning IV was confirmed and monitors were applied. A time-out was performed. Hand hygiene and sterile gloves were used. The thigh was placed in a frog-leg position and prepped in a sterile fashion. A 20ga BBraun echogenic stimulator needle was placed using ultrasound guidance.  Negative aspiration and negative test dose prior to incremental administration of local anesthetic. The patient tolerated the procedure well.

## 2020-04-21 NOTE — Progress Notes (Signed)
Assisted Dr. Ellender with right, ultrasound guided, femoral block. Side rails up, monitors on throughout procedure. See vital signs in flow sheet. Tolerated Procedure well.  

## 2020-04-21 NOTE — H&P (Signed)
ORTHOPAEDIC CONSULTATION  REQUESTING PHYSICIAN: Renette Butters, MD  Chief Complaint: R patella fx  HPI: Stephen Dudley is a 54 y.o. male who complains of displaced R patella fx  Past Medical History:  Diagnosis Date  . Closed patellar sleeve fracture of right knee   . Dysthymia   . Hypertension    Past Surgical History:  Procedure Laterality Date  . MANDIBLE FRACTURE SURGERY  25 yrs ago  . TOE SURGERY  20 yrs ago   Social History   Socioeconomic History  . Marital status: Single    Spouse name: Not on file  . Number of children: Not on file  . Years of education: Not on file  . Highest education level: Not on file  Occupational History  . Not on file  Tobacco Use  . Smoking status: Never Smoker  . Smokeless tobacco: Never Used  Vaping Use  . Vaping Use: Never used  Substance and Sexual Activity  . Alcohol use: Yes    Alcohol/week: 0.0 standard drinks    Comment: Rare  . Drug use: Yes    Types: Marijuana    Comment: last use marijuana 04-06-2020  . Sexual activity: Not Currently  Other Topics Concern  . Not on file  Social History Narrative  . Not on file   Social Determinants of Health   Financial Resource Strain:   . Difficulty of Paying Living Expenses:   Food Insecurity:   . Worried About Charity fundraiser in the Last Year:   . Arboriculturist in the Last Year:   Transportation Needs:   . Film/video editor (Medical):   Marland Kitchen Lack of Transportation (Non-Medical):   Physical Activity:   . Days of Exercise per Week:   . Minutes of Exercise per Session:   Stress:   . Feeling of Stress :   Social Connections:   . Frequency of Communication with Friends and Family:   . Frequency of Social Gatherings with Friends and Family:   . Attends Religious Services:   . Active Member of Clubs or Organizations:   . Attends Archivist Meetings:   Marland Kitchen Marital Status:    History reviewed. No pertinent family history. Allergies  Allergen  Reactions  . Penicillins     hives  . Vicodin [Hydrocodone-Acetaminophen]     n/v  . Penicillins Rash   Prior to Admission medications   Medication Sig Start Date End Date Taking? Authorizing Provider  HYDROcodone-acetaminophen (NORCO/VICODIN) 5-325 MG tablet Take 1 tablet by mouth every 6 (six) hours as needed for severe pain. 04/09/20   Carmin Muskrat, MD  ibuprofen (ADVIL) 200 MG tablet Take 200 mg by mouth every 6 (six) hours as needed for moderate pain.    [provider]  lisinopril-hydrochlorothiazide (ZESTORETIC) 20-12.5 MG tablet Take 1 tablet by mouth daily. 05/13/19   Denita Lung, MD  PARoxetine (PAXIL) 20 MG tablet Take 1 tablet (20 mg total) by mouth daily. 05/13/19   Denita Lung, MD   No results found.  Positive ROS: All other systems have been reviewed and were otherwise negative with the exception of those mentioned in the HPI and as above.  Labs cbc No results for input(s): WBC, HGB, HCT, PLT in the last 72 hours.  Labs inflam No results for input(s): CRP in the last 72 hours.  Invalid input(s): ESR  Labs coag No results for input(s): INR, PTT in the last 72 hours.  Invalid input(s):  PT  No results for input(s): NA, K, CL, CO2, GLUCOSE, BUN, CREATININE, CALCIUM in the last 72 hours.  Physical Exam: There were no vitals filed for this visit. General: Alert, no acute distress Cardiovascular: No pedal edema Respiratory: No cyanosis, no use of accessory musculature GI: No organomegaly, abdomen is soft and non-tender Skin: No lesions in the area of chief complaint other than those listed below in MSK exam.  Neurologic: Sensation intact distally save for the below mentioned MSK exam Psychiatric: Patient is competent for consent with normal mood and affect Lymphatic: No axillary or cervical lymphadenopathy  MUSCULOSKELETAL:  RLE: NVI, swellingat the patella Other extremities are atraumatic with painless ROM and NVI.  Assessment: R patella  fracture and med/lat capsular rupture  Plan: Orif today with capsullar repair on both med and lateral side today.    Renette Butters, MD    04/21/2020 8:37 AM

## 2020-04-21 NOTE — Anesthesia Postprocedure Evaluation (Signed)
Anesthesia Post Note  Patient: Stephen Dudley  Procedure(s) Performed: OPEN REDUCTION INTERNAL (ORIF) FIXATION PATELLA (Right Knee)     Patient location during evaluation: PACU Anesthesia Type: Regional and General Level of consciousness: awake Pain management: pain level controlled Vital Signs Assessment: post-procedure vital signs reviewed and stable Respiratory status: spontaneous breathing, nonlabored ventilation, respiratory function stable and patient connected to nasal cannula oxygen Cardiovascular status: blood pressure returned to baseline and stable Postop Assessment: no apparent nausea or vomiting Anesthetic complications: no   No complications documented.  Last Vitals:  Vitals:   04/21/20 1315 04/21/20 1330  BP: (!) 163/93 (!) 131/92  Pulse: 86 76  Resp: 15 16  Temp:    SpO2:  100%    Last Pain:  Vitals:   04/21/20 1330  TempSrc:   PainSc: 4                  Aishi Courts P Indi [place]

## 2020-04-21 NOTE — Anesthesia Preprocedure Evaluation (Addendum)
Anesthesia Evaluation  Patient identified by MRN, date of birth, ID band Patient awake    Reviewed: Allergy & Precautions, NPO status , Patient's Chart, lab work & pertinent test results  Airway Mallampati: II  TM Distance: >3 FB Neck ROM: Full    Dental  (+) Poor Dentition, Loose,    Pulmonary Current Smoker and Patient abstained from smoking.,    Pulmonary exam normal breath sounds clear to auscultation       Cardiovascular hypertension, Pt. on medications Normal cardiovascular exam Rhythm:Regular Rate:Normal  ECG: NSR, rate 71   Neuro/Psych PSYCHIATRIC DISORDERS Depression negative neurological ROS     GI/Hepatic negative GI ROS, (+)     substance abuse  marijuana use,   Endo/Other  negative endocrine ROS  Renal/GU negative Renal ROS     Musculoskeletal negative musculoskeletal ROS (+)   Abdominal   Peds  Hematology  (+) anemia ,   Anesthesia Other Findings RIGHT PATELLA FRACTURE  Reproductive/Obstetrics                           Anesthesia Physical Anesthesia Plan  ASA: II  Anesthesia Plan: General and Regional   Post-op Pain Management: GA combined w/ Regional for post-op pain   Induction: Intravenous  PONV Risk Score and Plan: 2 and Ondansetron, Dexamethasone, Midazolam and Treatment may vary due to age or medical condition  Airway Management Planned: Oral ETT  Additional Equipment:   Intra-op Plan:   Post-operative Plan: Extubation in OR  Informed Consent: I have reviewed the patients History and Physical, chart, labs and discussed the procedure including the risks, benefits and alternatives for the proposed anesthesia with the patient or authorized representative who has indicated his/her understanding and acceptance.     Dental advisory given  Plan Discussed with: CRNA  Anesthesia Plan Comments:       Anesthesia Quick Evaluation

## 2020-04-22 ENCOUNTER — Encounter (HOSPITAL_BASED_OUTPATIENT_CLINIC_OR_DEPARTMENT_OTHER): Payer: Self-pay | Admitting: Orthopedic Surgery

## 2020-04-22 NOTE — Addendum Note (Signed)
Addendum  created 04/22/20 0805 by Francie Massing, CRNA   Charge Capture section accepted

## 2020-04-23 ENCOUNTER — Telehealth: Payer: Self-pay

## 2020-04-23 DIAGNOSIS — Z1211 Encounter for screening for malignant neoplasm of colon: Secondary | ICD-10-CM

## 2020-04-23 NOTE — Telephone Encounter (Signed)
Spoke to pt about new cologuard order and advise pt it needs to sent back within 60 days. KH

## 2020-04-29 DIAGNOSIS — M23601 Other spontaneous disruption of unspecified ligament of right knee: Secondary | ICD-10-CM | POA: Diagnosis not present

## 2020-05-27 DIAGNOSIS — S82001D Unspecified fracture of right patella, subsequent encounter for closed fracture with routine healing: Secondary | ICD-10-CM | POA: Diagnosis not present

## 2020-05-27 DIAGNOSIS — M23601 Other spontaneous disruption of unspecified ligament of right knee: Secondary | ICD-10-CM | POA: Diagnosis not present

## 2020-06-05 ENCOUNTER — Other Ambulatory Visit: Payer: Self-pay

## 2020-06-05 ENCOUNTER — Ambulatory Visit: Payer: Medicaid Other | Attending: Orthopedic Surgery | Admitting: Physical Therapy

## 2020-06-05 ENCOUNTER — Encounter: Payer: Self-pay | Admitting: Physical Therapy

## 2020-06-05 DIAGNOSIS — M25561 Pain in right knee: Secondary | ICD-10-CM | POA: Insufficient documentation

## 2020-06-05 DIAGNOSIS — M546 Pain in thoracic spine: Secondary | ICD-10-CM | POA: Insufficient documentation

## 2020-06-05 DIAGNOSIS — M25661 Stiffness of right knee, not elsewhere classified: Secondary | ICD-10-CM | POA: Diagnosis not present

## 2020-06-05 DIAGNOSIS — R2689 Other abnormalities of gait and mobility: Secondary | ICD-10-CM | POA: Diagnosis not present
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
## 2020-06-06 DIAGNOSIS — S82001A Unspecified fracture of right patella, initial encounter for closed fracture: Secondary | ICD-10-CM | POA: Diagnosis not present

## 2020-06-12 ENCOUNTER — Ambulatory Visit: Payer: Medicaid Other

## 2020-06-12 ENCOUNTER — Other Ambulatory Visit: Payer: Self-pay

## 2020-06-12 DIAGNOSIS — R2689 Other abnormalities of gait and mobility: Secondary | ICD-10-CM

## 2020-06-12 DIAGNOSIS — M25561 Pain in right knee: Secondary | ICD-10-CM

## 2020-06-12 DIAGNOSIS — M25661 Stiffness of right knee, not elsewhere classified: Secondary | ICD-10-CM | POA: Diagnosis not present

## 2020-06-12 DIAGNOSIS — M546 Pain in thoracic spine: Secondary | ICD-10-CM | POA: Diagnosis not present

## 2020-06-12 NOTE — Therapy (Signed)
[facility] [address]  [place], [place], [address] Phone: [phone]   Fax:  [phone]  Physical Therapy Treatment  Patient Details  Name: Stephen Dudley MRN: [id number] Date of Birth: 11-15-1965 Referring Provider (PT): Edmonia Lynch, MD   Encounter Date: 06/12/2020   PT End of Session - 06/12/20 0902    Visit Number 2    Number of Visits 4    Date for PT Re-Evaluation 06/26/20    Authorization Type Medicaid Healthy Blue    Authorization Time Period (3 appointments in 1 month's time)    PT Start Time 0849    PT Stop Time 0928    PT Time Calculation (min) 39 min    Activity Tolerance Patient tolerated treatment well    Behavior During Therapy [facility] for tasks assessed/performed           Past Medical History:  Diagnosis Date  . Closed patellar sleeve fracture of right knee   . Dysthymia   . Hypertension     Past Surgical History:  Procedure Laterality Date  . MANDIBLE FRACTURE SURGERY  25 yrs ago  . ORIF PATELLA Right 04/21/2020   Procedure: OPEN REDUCTION INTERNAL (ORIF) FIXATION PATELLA;  Surgeon: Renette Butters, MD;  Location: [facility];  Service: Orthopedics;  Laterality: Right;  . TOE SURGERY  20 yrs ago    There were no vitals filed for this visit.   Subjective Assessment - 06/12/20 0859    Subjective pt. doing well.    Pertinent History HTN, dysthymia, toe surgery    Diagnostic tests 04/09/20 R knee: Comminuted distracted mid patellar fracture; no acute abnormalities on chest, thoracic, or lumbar xrays on 04/09/20    Patient Stated Goals work on knee motion    Currently in Pain? No/denies    Pain Score 0-No pain   pain up to 5/10 at most when "i'm doing stuff".   Pain Location Knee    Pain Orientation Right    Pain Descriptors / Indicators Aching    Pain Type Acute pain                             [facility] Adult PT Treatment/Exercise - 06/12/20 0001        Knee/Hip Exercises: Stretches   Passive Hamstring Stretch Right;1 rep;30 seconds    Passive Hamstring Stretch Limitations supine with strap     Hip Flexor Stretch Left;1 rep;30 seconds    Hip Flexor Stretch Limitations mod thomas position with strap       Knee/Hip Exercises: Aerobic   Nustep Lvl 1, 6 min (UE/LE)      Knee/Hip Exercises: Standing   Functional Squat 10 reps;3 seconds    Functional Squat Limitations cues for performance at sink and cues for even weight shift       Knee/Hip Exercises: Supine   Heel Slides Right;AAROM;10 reps    Heel Slides Limitations pillow case under shoe; strap assistance     Bridges Both;10 reps;Strengthening    Bridges Limitations cues for even weight distribution     Straight Leg Raises Right;10 reps;Strengthening    Straight Leg Raises Limitations cues for quad set       Knee/Hip Exercises: Sidelying   Hip ABduction Right;10 reps;Strengthening    Hip ABduction Limitations cues for alignment  PT Short Term Goals - 06/12/20 0904      PT SHORT TERM GOAL #1   Title Patient to be independent with initial HEP.    Time 1    Period Weeks    Status Achieved    Target Date 06/12/20             PT Long Term Goals - 06/12/20 0904      PT LONG TERM GOAL #1   Title Patient to be independent with advanced HEP.    Time 3    Period Weeks    Status On-going      PT LONG TERM GOAL #2   Title Patient to demonstrate R knee AROM/PROM 0-120 degrees.    Time 3    Period Weeks    Status On-going      PT LONG TERM GOAL #3   Title Patient to demonstrate symmetrical step length, weight shift, and knee flexion with ambulation.    Time 3    Period Weeks    Status On-going      PT LONG TERM GOAL #4   Title Patient to demonstrate good quad control and report no pain when navigating 13 steps with 1 handrail as needed.    Time 3    Period Weeks    Status On-going                 Plan - 06/12/20 0915     Clinical Impression Statement Reiss reporting minimal R knee pain only when "doing things".  Remained largely pain free in session.  Tolerated standing and supine/sidelying strengthening and ROM activities well.  Only required cueing for proper breathing pattern and hold times with HEP however demonstrated good understanding.  Visible improvement in R knee AROM flexion.  Ended visit pain free thus modalities deferred.  STG #1 met.    Comorbidities HTN, dysthymia, toe surgery    Rehab Potential Good    PT Frequency 1x / week    PT Duration 3 weeks    PT Treatment/Interventions ADLs/Self Care Home Management;Cryotherapy;Electrical Stimulation;Moist Heat;Balance training;Therapeutic exercise;Therapeutic activities;Functional mobility training;Stair training;Gait training;Ultrasound;Neuromuscular re-education;Patient/family education;Manual techniques;Taping;Energy conservation;Dry needling;Passive range of motion;Scar mobilization    PT Next Visit Plan Progress R knee ROM, assess R scapular/thoracic/chest pain           Patient will benefit from skilled therapeutic intervention in order to improve the following deficits and impairments:  Increased edema, Decreased scar mobility, Decreased activity tolerance, Pain, Increased fascial restricitons, Decreased strength, Decreased balance, Difficulty walking, Increased muscle spasms, Improper body mechanics, Decreased range of motion, Postural dysfunction, Impaired flexibility  Visit Diagnosis: Stiffness of right knee, not elsewhere classified  Acute pain of right knee  Pain in thoracic spine  Other abnormalities of gait and mobility     Problem List Patient Active Problem List   Diagnosis Date Noted  . Current smoker 10/06/2016  . Depression 07/25/2011  . Hypertension 07/25/2011    Bess Harvest, PTA 06/12/20 9:37 AM   [facility] [address]  [place], [place],  [address] Phone: [phone]   Fax:  [phone]  Name: Stephen Dudley MRN: [id number] Date of Birth: 09/03/1966

## 2020-06-26 ENCOUNTER — Other Ambulatory Visit: Payer: Self-pay

## 2020-06-26 ENCOUNTER — Encounter: Payer: Self-pay | Admitting: Physical Therapy

## 2020-06-26 ENCOUNTER — Ambulatory Visit: Payer: Medicaid Other | Attending: Orthopedic Surgery | Admitting: Physical Therapy

## 2020-06-26 DIAGNOSIS — R2689 Other abnormalities of gait and mobility: Secondary | ICD-10-CM | POA: Diagnosis not present

## 2020-06-26 DIAGNOSIS — M25561 Pain in right knee: Secondary | ICD-10-CM | POA: Diagnosis not present

## 2020-06-26 DIAGNOSIS — M546 Pain in thoracic spine: Secondary | ICD-10-CM | POA: Insufficient documentation

## 2020-06-26 DIAGNOSIS — M25661 Stiffness of right knee, not elsewhere classified: Secondary | ICD-10-CM

## 2020-06-26 NOTE — Therapy (Signed)
[facility] [address]  [place], [place], [address] Phone: [phone]   Fax:  [phone]  Physical Therapy Treatment  Patient Details  Name: Stephen Dudley MRN: [id number] Date of Birth: 04-08-1966 Referring Provider (PT): Edmonia Lynch, MD   Encounter Date: 06/26/2020   PT End of Session - 06/26/20 0936    Visit Number 3    Number of Visits 7    Date for PT Re-Evaluation 07/24/20    Authorization Type Medicaid [employer]    Authorization Time Period --    PT Start Time 0850    PT Stop Time 0931    PT Time Calculation (min) 41 min    Activity Tolerance Patient tolerated treatment well;Patient limited by pain    Behavior During Therapy [facility] for tasks assessed/performed           Past Medical History:  Diagnosis Date  . Closed patellar sleeve fracture of right knee   . Dysthymia   . Hypertension     Past Surgical History:  Procedure Laterality Date  . MANDIBLE FRACTURE SURGERY  25 yrs ago  . ORIF PATELLA Right 04/21/2020   Procedure: OPEN REDUCTION INTERNAL (ORIF) FIXATION PATELLA;  Surgeon: Renette Butters, MD;  Location: [place];  Service: Orthopedics;  Laterality: Right;  . TOE SURGERY  20 yrs ago    There were no vitals filed for this visit.   Subjective Assessment - 06/26/20 0852    Subjective The knee feels really good and notes that he is better-able to bend his knee. Reports 75% improvement since initial eval. Feels that he still needs to work on walking without his brace. Reports consistency with HEP and denies questions. Also reports that he is no longer having chest pain.    Pertinent History HTN, dysthymia, toe surgery    Diagnostic tests 04/09/20 R knee: Comminuted distracted mid patellar fracture; no acute abnormalities on chest, thoracic, or lumbar xrays on 04/09/20    Patient Stated Goals work on knee motion    Currently in Pain? No/denies              [facility]  PT Assessment - 06/26/20 0001      Assessment   Medical Diagnosis ORIF R patella    Referring Provider (PT) Edmonia Lynch, MD    Onset Date/Surgical Date 04/21/20      AROM   Right Knee Extension 0    Right Knee Flexion 111      PROM   Right Knee Extension 0    Right Knee Flexion 120   audible crepitus on extension                        [facility] Adult PT Treatment/Exercise - 06/26/20 0001      Ambulation/Gait   Ambulation Distance (Feet) 150 Feet    Assistive device None    Gait Pattern Step-through pattern;Decreased weight shift to right;Decreased hip/knee flexion - right;Trunk flexed;Lateral trunk lean to left    Ambulation Surface Level;Indoor    Stairs Yes    Stairs Assistance 4: Min guard;5: Supervision    Stair Management Technique One rail Right;Alternating pattern;Step to pattern    Number of Stairs 13    Height of Stairs 8    Gait Comments able to perform reciprocal ascending pattern when prompted, but with L LE dominant step-to pattern descending   5/10 pain in R knee  Knee/Hip Exercises: Aerobic   Nustep Lvl 2, 6 min (UE/LE)      Knee/Hip Exercises: Standing   Knee Flexion Strengthening;Right;1 set;10 reps    Knee Flexion Limitations HS curl with R loop    Terminal Knee Extension Strengthening;Right;1 set;10 reps;Theraband    Theraband Level (Terminal Knee Extension) Level 4 (Blue)    Terminal Knee Extension Limitations 10x3" at TM rail    Lateral Step Up Right;1 set;5 reps;Hand Hold: 2;Step Height: 4"    Lateral Step Up Limitations c/o 4/10 knee pain    Forward Step Up Right;2 sets;10 reps;Hand Hold: 1;Step Height: 4";Step Height: 6"    Forward Step Up Limitations R step up, L step back   palpable cavitation over R patella- nonpainfiul     Knee/Hip Exercises: Supine   Heel Slides Right;AAROM;10 reps    Heel Slides Limitations with strap and orange pball   cues to avoid pushing into pain                 PT Education - 06/26/20 0936     Education Details update to HEP; discussion on objective progress and remaining impairments    Person(s) Educated Patient    Methods Explanation;Demonstration;Tactile cues;Handout;Verbal cues    Comprehension Verbalized understanding;Returned demonstration            PT Short Term Goals - 06/26/20 9373      PT SHORT TERM GOAL #1   Title Patient to be independent with initial HEP.    Time 1    Period Weeks    Status Achieved    Target Date 06/12/20             PT Long Term Goals - 06/26/20 4287      PT LONG TERM GOAL #1   Title Patient to be independent with advanced HEP.    Time 4    Period Weeks    Status Partially Met   met for current   Target Date 07/24/20      PT LONG TERM GOAL #2   Title Patient to demonstrate R knee AROM/PROM 0-120 degrees.    Time 4    Period Weeks    Status Achieved   AROM 0-111 degrees, PROM 0-120 degrees   Target Date 07/24/20      PT LONG TERM GOAL #3   Title Patient to demonstrate symmetrical step length, weight shift, and knee flexion with ambulation.    Time 4    Period Weeks    Status On-going   improved step length but still with L lateral trunk lean and decreased R knee flexion   Target Date 07/24/20      PT LONG TERM GOAL #4   Title Patient to demonstrate good quad control and report no pain when navigating 13 steps with 1 handrail as needed.    Time 4    Period Weeks    Status On-going   reciprocal ascending pattern and L LE dominant step-to pattern when descending   Target Date 07/24/20                 Plan - 06/26/20 6811    Clinical Impression Statement Patient reporting 75% improvement in R knee since initial eval. Noting "the knee feels really good" and is better-able to bend his knee, but feels that he still needs to work on walking without his brace. Notes that he is no longer having chest pain. R knee ROM reaching AROM 0-111 degrees, PROM 0-120 degrees at  this time. Patient demonstrates a L lateral trunk  lean and decreased R knee flexion during ambulation. Initiated stair training, with patient able to perform reciprocal ascending pattern when prompted, but with L LE dominant step-to pattern when descending, with report of 5/10 pain in R knee. Worked on short step up/downs with patient demonstrating hesitancy to unlock R knee with step down, which improved with cueing. Continued working on progressive quad and HS strengthening with good tolerance. Updated HEP with exercises that were well-tolerated today. Patient reported understanding and without complaints at end of session> patient is progressing well towards goals. Would benefit from continued skilled PT services 1x/week for 4 weeks to address remaining goals.    Comorbidities HTN, dysthymia, toe surgery    Rehab Potential Good    PT Frequency 1x / week    PT Duration 4 weeks    PT Treatment/Interventions ADLs/Self Care Home Management;Cryotherapy;Electrical Stimulation;Moist Heat;Balance training;Therapeutic exercise;Therapeutic activities;Functional mobility training;Stair training;Gait training;Ultrasound;Neuromuscular re-education;Patient/family education;Manual techniques;Taping;Energy conservation;Dry needling;Passive range of motion;Scar mobilization    PT Next Visit Plan Progress R knee ROM, stair training    Consulted and Agree with Plan of Care Patient           Patient will benefit from skilled therapeutic intervention in order to improve the following deficits and impairments:  Increased edema, Decreased scar mobility, Decreased activity tolerance, Pain, Increased fascial restricitons, Decreased strength, Decreased balance, Difficulty walking, Increased muscle spasms, Improper body mechanics, Decreased range of motion, Postural dysfunction, Impaired flexibility  Visit Diagnosis: Stiffness of right knee, not elsewhere classified  Acute pain of right knee  Pain in thoracic spine  Other abnormalities of gait and  mobility     Problem List Patient Active Problem List   Diagnosis Date Noted  . Current smoker 10/06/2016  . Depression 07/25/2011  . Hypertension 07/25/2011     Janene Harvey, PT, DPT 06/26/20 9:48 AM   [facility] [address]  [place], [place], [address] Phone: [phone]   Fax:  [phone]  Name: RODY KEADLE MRN: [id number] Date of Birth: 13-Jul-1966

## 2020-07-01 DIAGNOSIS — M23601 Other spontaneous disruption of unspecified ligament of right knee: Secondary | ICD-10-CM | POA: Diagnosis not present

## 2020-07-02 ENCOUNTER — Ambulatory Visit: Payer: Medicaid Other

## 2020-07-02 ENCOUNTER — Other Ambulatory Visit: Payer: Self-pay

## 2020-07-02 DIAGNOSIS — M25561 Pain in right knee: Secondary | ICD-10-CM | POA: Diagnosis not present

## 2020-07-02 DIAGNOSIS — R2689 Other abnormalities of gait and mobility: Secondary | ICD-10-CM | POA: Diagnosis not present

## 2020-07-02 DIAGNOSIS — M25661 Stiffness of right knee, not elsewhere classified: Secondary | ICD-10-CM

## 2020-07-02 DIAGNOSIS — M546 Pain in thoracic spine: Secondary | ICD-10-CM | POA: Diagnosis not present

## 2020-07-02 NOTE — Therapy (Signed)
[facility] [address]  [place], [place], [address] Phone: [phone]   Fax:  [phone]  Physical Therapy Treatment  Patient Details  Name: Stephen Dudley MRN: [id number] Date of Birth: [birth year] Referring Provider (PT): Edmonia Lynch, MD   Encounter Date: 07/02/2020   PT End of Session - 07/02/20 1324    Visit Number 4    Number of Visits 7    Date for PT Re-Evaluation 07/24/20    Authorization Type Medicaid [employer]    PT Start Time 9983    PT Stop Time 1400    PT Time Calculation (min) 43 min    Activity Tolerance Patient tolerated treatment well    Behavior During Therapy [facility] for tasks assessed/performed           Past Medical History:  Diagnosis Date  . Closed patellar sleeve fracture of right knee   . Dysthymia   . Hypertension     Past Surgical History:  Procedure Laterality Date  . MANDIBLE FRACTURE SURGERY  25 yrs ago  . ORIF PATELLA Right 04/21/2020   Procedure: OPEN REDUCTION INTERNAL (ORIF) FIXATION PATELLA;  Surgeon: Renette Butters, MD;  Location: [place];  Service: Orthopedics;  Laterality: Right;  . TOE SURGERY  20 yrs ago    There were no vitals filed for this visit.   Subjective Assessment - 07/02/20 1320    Subjective Pt. reporting that MD opened his knee braceto 90 dg flexion stop which has been feeling good while he has been walking.    Pertinent History HTN, dysthymia, toe surgery    Diagnostic tests 04/09/20 R knee: Comminuted distracted mid patellar fracture; no acute abnormalities on chest, thoracic, or lumbar xrays on 04/09/20    Patient Stated Goals work on knee motion    Currently in Pain? No/denies    Pain Score 0-No pain   soreness at R knee up to 1/10   Pain Location Knee    Pain Orientation Right    Pain Descriptors / Indicators Aching    Pain Type Acute pain    Multiple Pain Sites No              [facility] PT Assessment - 07/02/20  0001      Assessment   Medical Diagnosis ORIF R patella    Referring Provider (PT) Edmonia Lynch, MD    Onset Date/Surgical Date 04/21/20    Hand Dominance Right    Next MD Visit 08/05/20    Prior Therapy no      AROM   AROM Assessment Site Knee    Right/Left Knee Right    Right Knee Extension 0    Right Knee Flexion 124      PROM   PROM Assessment Site Knee    Right/Left Knee Right    Right Knee Extension 0    Right Knee Flexion 126                         [facility] Adult PT Treatment/Exercise - 07/02/20 0001      Ambulation/Gait   Ambulation/Gait Yes    Ambulation Distance (Feet) 150 Feet    Assistive device None    Gait Pattern Step-through pattern;Decreased hip/knee flexion - right;Lateral trunk lean to left    Ambulation Surface Level;Indoor    Stairs Yes    Stairs Assistance 5: Supervision    Stair Management Technique One rail Right;Alternating  pattern;Step to pattern    Number of Stairs 13    Height of Stairs 8    Gait Comments Able to perform step-through pattern without pain with R knee brace on and dialed out to 90 dg knee flexion       Knee/Hip Exercises: Stretches   Hip Flexor Stretch Right;1 rep;30 seconds    Hip Flexor Stretch Limitations mod thomas position with strap       Knee/Hip Exercises: Aerobic   Nustep Lvl 4, 6 min (UE/LE)      Knee/Hip Exercises: Standing   Forward Step Up Right;10 reps;Step Height: 6";Hand Hold: 2    Forward Step Up Limitations at machine rail     Step Down Right;10 reps;Step Height: 6";Hand Hold: 2    Step Down Limitations at machine       Knee/Hip Exercises: Supine   Bridges Both;15 reps;Strengthening    Bridges Limitations cues for proper breathing pattern                     PT Short Term Goals - 06/26/20 7341      PT SHORT TERM GOAL #1   Title Patient to be independent with initial HEP.    Time 1    Period Weeks    Status Achieved    Target Date 06/12/20             PT Long  Term Goals - 07/02/20 1348      PT LONG TERM GOAL #1   Title Patient to be independent with advanced HEP.    Time 4    Period Weeks    Status Partially Met   met for current     PT LONG TERM GOAL #2   Title Patient to demonstrate R knee AROM/PROM 0-120 degrees.    Time 4    Period Weeks    Status Achieved   AROM 0-124 degrees, PROM 0-126 degrees     PT LONG TERM GOAL #3   Title Patient to demonstrate symmetrical step length, weight shift, and knee flexion with ambulation.    Time 4    Period Weeks    Status Partially Met   07/02/20: improved step length but still with L lateral trunk lean and decreased R knee flexion; still somewhat limited with R knee ROM by knee brace     PT LONG TERM GOAL #4   Title Patient to demonstrate good quad control and report no pain when navigating 13 steps with 1 handrail as needed.    Time 4    Period Weeks    Status Partially Met   07/02/20:  reciprocal ascending pattern and L LE dominant step-to pattern when descending along with intermittent step-to pattern due to more comfidence with this pattern                Plan - 07/02/20 1328    Clinical Impression Statement Stephen Dudley reporting he saw MD yesterday who opened his knee brace ROM stop out to 90 dg knee flexion which has felt good for him.  Pt. was able to tolerate progression of step-down quad eccentric strengthening along with demonstrate progress of R knee flexion AROM 124 dg, PROM flexion 126 dg maintaining 0 dg AROM/PROM extension.  Ended visit with pt. pain free.    Comorbidities HTN, dysthymia, toe surgery    Rehab Potential Good    PT Frequency 1x / week    PT Duration 4 weeks    PT Treatment/Interventions ADLs/Self  Care Home Management;Cryotherapy;Electrical Stimulation;Moist Heat;Balance training;Therapeutic exercise;Therapeutic activities;Functional mobility training;Stair training;Gait training;Ultrasound;Neuromuscular re-education;Patient/family education;Manual  techniques;Taping;Energy conservation;Dry needling;Passive range of motion;Scar mobilization    PT Next Visit Plan Progress R knee ROM, stair training    Consulted and Agree with Plan of Care Patient           Patient will benefit from skilled therapeutic intervention in order to improve the following deficits and impairments:  Increased edema, Decreased scar mobility, Decreased activity tolerance, Pain, Increased fascial restricitons, Decreased strength, Decreased balance, Difficulty walking, Increased muscle spasms, Improper body mechanics, Decreased range of motion, Postural dysfunction, Impaired flexibility  Visit Diagnosis: Stiffness of right knee, not elsewhere classified  Acute pain of right knee  Pain in thoracic spine  Other abnormalities of gait and mobility     Problem List Patient Active Problem List   Diagnosis Date Noted  . Current smoker 10/06/2016  . Depression 07/25/2011  . Hypertension 07/25/2011    Bess Harvest, PTA 07/02/20 4:59 PM   [facility] [address]  [place], [place], [address] Phone: [phone]   Fax:  [phone]  Name: Stephen Dudley MRN: [id number] Date of Birth: 1966-06-05

## 2020-07-08 ENCOUNTER — Ambulatory Visit: Payer: Medicaid Other

## 2020-07-08 ENCOUNTER — Other Ambulatory Visit: Payer: Self-pay

## 2020-07-08 DIAGNOSIS — M546 Pain in thoracic spine: Secondary | ICD-10-CM | POA: Diagnosis not present

## 2020-07-08 DIAGNOSIS — M25561 Pain in right knee: Secondary | ICD-10-CM | POA: Diagnosis not present

## 2020-07-08 DIAGNOSIS — R2689 Other abnormalities of gait and mobility: Secondary | ICD-10-CM

## 2020-07-08 DIAGNOSIS — M25661 Stiffness of right knee, not elsewhere classified: Secondary | ICD-10-CM | POA: Diagnosis not present

## 2020-07-08 NOTE — Therapy (Signed)
[facility] [address]  [place], [place], [address] Phone: [phone]   Fax:  [phone]  Physical Therapy Treatment  Patient Details  Name: Stephen Dudley MRN: [id number] Date of Birth: 1966-08-06 Referring Provider (PT): Edmonia Lynch, MD   Encounter Date: 07/08/2020   PT End of Session - 07/08/20 1414    Visit Number 5    Number of Visits 7    Date for PT Re-Evaluation 07/24/20    Authorization Type Medicaid [employer]    PT Start Time 1400    PT Stop Time 1442    PT Time Calculation (min) 42 min    Activity Tolerance Patient tolerated treatment well    Behavior During Therapy [facility] for tasks assessed/performed           Past Medical History:  Diagnosis Date  . Closed patellar sleeve fracture of right knee   . Dysthymia   . Hypertension     Past Surgical History:  Procedure Laterality Date  . MANDIBLE FRACTURE SURGERY  25 yrs ago  . ORIF PATELLA Right 04/21/2020   Procedure: OPEN REDUCTION INTERNAL (ORIF) FIXATION PATELLA;  Surgeon: Renette Butters, MD;  Location: [place];  Service: Orthopedics;  Laterality: Right;  . TOE SURGERY  20 yrs ago    There were no vitals filed for this visit.   Subjective Assessment - 07/08/20 1412    Subjective Pt. doing ok.    Pertinent History HTN, dysthymia, toe surgery    Diagnostic tests 04/09/20 R knee: Comminuted distracted mid patellar fracture; no acute abnormalities on chest, thoracic, or lumbar xrays on 04/09/20    Patient Stated Goals work on knee motion    Currently in Pain? No/denies    Pain Score 0-No pain    Pain Location Knee    Pain Orientation Right                             [facility] Adult PT Treatment/Exercise - 07/08/20 0001      Knee/Hip Exercises: Stretches   Passive Hamstring Stretch Right;1 rep;30 seconds    Passive Hamstring Stretch Limitations supine with strap     Hip Flexor Stretch Right;1  rep;30 seconds    Hip Flexor Stretch Limitations mod thomas position with strap       Knee/Hip Exercises: Aerobic   Nustep Lvl 4, 6 min (UE/LE)      Knee/Hip Exercises: Machines for Strengthening   Cybex Knee Flexion B LEs: 25# x 15 reps     Cybex Leg Press B LE's 35# x 15 reps       Knee/Hip Exercises: Standing   Forward Step Up Right;10 reps;Step Height: 6";Hand Hold: 2    Forward Step Up Limitations at machine rail     Step Down Right;10 reps;Step Height: 6";Hand Hold: 2    Step Down Limitations at machine       Knee/Hip Exercises: Supine   Bridges Both;15 reps;Strengthening      Knee/Hip Exercises: Sidelying   Hip ABduction Right;Left;15 reps    Hip ABduction Limitations cues for alignment    Hip ADduction Right;Left;10 reps;Strengthening    Hip ADduction Limitations cues for alignment                     PT Short Term Goals - 06/26/20 0938      PT SHORT TERM GOAL #1   Title  Patient to be independent with initial HEP.    Time 1    Period Weeks    Status Achieved    Target Date 06/12/20             PT Long Term Goals - 07/02/20 1348      PT LONG TERM GOAL #1   Title Patient to be independent with advanced HEP.    Time 4    Period Weeks    Status Partially Met   met for current     PT LONG TERM GOAL #2   Title Patient to demonstrate R knee AROM/PROM 0-120 degrees.    Time 4    Period Weeks    Status Achieved   AROM 0-124 degrees, PROM 0-126 degrees     PT LONG TERM GOAL #3   Title Patient to demonstrate symmetrical step length, weight shift, and knee flexion with ambulation.    Time 4    Period Weeks    Status Partially Met   07/02/20: improved step length but still with L lateral trunk lean and decreased R knee flexion; still somewhat limited with R knee ROM by knee brace     PT LONG TERM GOAL #4   Title Patient to demonstrate good quad control and report no pain when navigating 13 steps with 1 handrail as needed.    Time 4    Period Weeks     Status Partially Met   07/02/20:  reciprocal ascending pattern and L LE dominant step-to pattern when descending along with intermittent step-to pattern due to more comfidence with this pattern                Plan - 07/08/20 1441    Clinical Impression Statement Pt. doing well with no new complaints.  Has a sore shoulder after receiving his second covid-19 vaccine however no other complaints.  Progressed proximal hip strengthening and step-training without pain today.  Did have incidence of R patellar "popping" sensation with lateral 7" step-up however no pain with this activity.  Progressing well with machine strengthening.    Comorbidities HTN, dysthymia, toe surgery    Rehab Potential Good    PT Frequency 1x / week    PT Duration 4 weeks    PT Treatment/Interventions ADLs/Self Care Home Management;Cryotherapy;Electrical Stimulation;Moist Heat;Balance training;Therapeutic exercise;Therapeutic activities;Functional mobility training;Stair training;Gait training;Ultrasound;Neuromuscular re-education;Patient/family education;Manual techniques;Taping;Energy conservation;Dry needling;Passive range of motion;Scar mobilization    PT Next Visit Plan Progress R knee ROM, stair training    Consulted and Agree with Plan of Care Patient           Patient will benefit from skilled therapeutic intervention in order to improve the following deficits and impairments:  Increased edema, Decreased scar mobility, Decreased activity tolerance, Pain, Increased fascial restricitons, Decreased strength, Decreased balance, Difficulty walking, Increased muscle spasms, Improper body mechanics, Decreased range of motion, Postural dysfunction, Impaired flexibility  Visit Diagnosis: Stiffness of right knee, not elsewhere classified  Acute pain of right knee  Pain in thoracic spine  Other abnormalities of gait and mobility     Problem List Patient Active Problem List   Diagnosis Date Noted  . Current  smoker 10/06/2016  . Depression 07/25/2011  . Hypertension 07/25/2011    Bess Harvest, PTA 07/08/20 2:46 PM   [facility] [address]  [place], [place], [address] Phone: [phone]   Fax:  [phone]  Name: Stephen Dudley MRN: [id number] Date of Birth: June 18, 1966

## 2020-07-10 ENCOUNTER — Ambulatory Visit: Payer: Medicaid Other | Admitting: Physical Therapy

## 2020-07-17 ENCOUNTER — Ambulatory Visit: Payer: Medicaid Other | Admitting: Physical Therapy

## 2020-07-17 ENCOUNTER — Encounter: Payer: Self-pay | Admitting: Physical Therapy

## 2020-07-17 ENCOUNTER — Other Ambulatory Visit: Payer: Self-pay

## 2020-07-17 DIAGNOSIS — R2689 Other abnormalities of gait and mobility: Secondary | ICD-10-CM

## 2020-07-17 DIAGNOSIS — M25561 Pain in right knee: Secondary | ICD-10-CM

## 2020-07-17 DIAGNOSIS — M25661 Stiffness of right knee, not elsewhere classified: Secondary | ICD-10-CM

## 2020-07-17 DIAGNOSIS — M546 Pain in thoracic spine: Secondary | ICD-10-CM

## 2020-07-17 NOTE — Therapy (Signed)
[facility] [address]  [place], [place], [address] Phone: [phone]   Fax:  [phone]  Physical Therapy Treatment  Patient Details  Name: Stephen Dudley MRN: [id number] Date of Birth: 1965/11/23 Referring Provider (PT): Edmonia Lynch, MD   Encounter Date: 07/17/2020   PT End of Session - 07/17/20 0934    Visit Number 6    Number of Visits 10    Date for PT Re-Evaluation 08/14/20    Authorization Type Medicaid [employer]    PT Start Time 0845    PT Stop Time 0932    PT Time Calculation (min) 47 min    Activity Tolerance Patient tolerated treatment well    Behavior During Therapy [facility] for tasks assessed/performed           Past Medical History:  Diagnosis Date  . Closed patellar sleeve fracture of right knee   . Dysthymia   . Hypertension     Past Surgical History:  Procedure Laterality Date  . MANDIBLE FRACTURE SURGERY  25 yrs ago  . ORIF PATELLA Right 04/21/2020   Procedure: OPEN REDUCTION INTERNAL (ORIF) FIXATION PATELLA;  Surgeon: Renette Butters, MD;  Location: [facility];  Service: Orthopedics;  Laterality: Right;  . TOE SURGERY  20 yrs ago    There were no vitals filed for this visit.   Subjective Assessment - 07/17/20 0847    Subjective Been doing the exercises. "My knee is doing a lot better." Reports 85-90% improvement in R knee. Still hesitates when walking down the stairs.    Pertinent History HTN, dysthymia, toe surgery    Diagnostic tests 04/09/20 R knee: Comminuted distracted mid patellar fracture; no acute abnormalities on chest, thoracic, or lumbar xrays on 04/09/20    Patient Stated Goals work on knee motion    Currently in Pain? No/denies              [facility] PT Assessment - 07/17/20 0001      Assessment   Medical Diagnosis ORIF R patella    Referring Provider (PT) Edmonia Lynch, MD    Onset Date/Surgical Date 04/21/20      AROM   Right Knee  Extension 0    Right Knee Flexion 126      PROM   Right Knee Extension -1    Right Knee Flexion 132      Strength   Right Hip Flexion 4+/5    Right Hip ABduction 4+/5    Right Hip ADduction 4+/5    Left Hip Flexion 4+/5    Left Hip ABduction 4+/5    Left Hip ADduction 4+/5    Right Knee Flexion 4+/5    Right Knee Extension 4+/5    Left Knee Flexion 4+/5    Left Knee Extension 5/5    Right Ankle Dorsiflexion 4+/5    Right Ankle Plantar Flexion 4+/5    Left Ankle Dorsiflexion 4+/5    Left Ankle Plantar Flexion 4+/5                         [facility] Adult PT Treatment/Exercise - 07/17/20 0001      Ambulation/Gait   Ambulation Distance (Feet) 100 Feet    Assistive device None    Gait Pattern Step-through pattern;Lateral trunk lean to left    Ambulation Surface Level;Indoor    Stairs Yes    Stairs Assistance 4: Min guard    Stair  Management Technique Alternating pattern;No rails    Number of Stairs 13    Height of Stairs 8    Gait Comments forward trunk lean when ascending d/t R LE weakness; poor stability and eccentric control when descending      Knee/Hip Exercises: Stretches   Passive Hamstring Stretch Right;30 seconds;2 reps    Passive Hamstring Stretch Limitations supine with strap     Hip Flexor Stretch Right;30 seconds;2 reps    Hip Flexor Stretch Limitations mod thomas position with strap       Knee/Hip Exercises: Aerobic   Recumbent Bike L1 x 6 min      Knee/Hip Exercises: Standing   Terminal Knee Extension Strengthening;Right;1 set;10 reps;Theraband    Theraband Level (Terminal Knee Extension) Level 4 (Blue)    Terminal Knee Extension Limitations 10x3" at TM rail   cueing to control extension without hyperextending   Lateral Step Up Right;1 set;10 reps;Hand Hold: 2;Step Height: 4"    Lateral Step Up Limitations cueing to correct extensor thrust upon step up    Forward Step Up Right;Step Height: 6";15 reps;Hand Hold: 1    Forward Step Up  Limitations R step up/back    Wall Squat 1 set;10 reps    SLS L SLS + 3 toe tap on cone x10   cues to contract core and hips                 PT Education - 07/17/20 0934    Education Details update to HEP; discussion on objective progress and remaining impairments    Person(s) Educated Patient    Methods Explanation;Demonstration;Tactile cues;Verbal cues;Handout    Comprehension Verbalized understanding;Returned demonstration            PT Short Term Goals - 07/17/20 0850      PT SHORT TERM GOAL #1   Title Patient to be independent with initial HEP.    Time 1    Period Weeks    Status Achieved    Target Date 06/12/20             PT Long Term Goals - 07/17/20 0850      PT LONG TERM GOAL #1   Title Patient to be independent with advanced HEP.    Time 4    Period Weeks    Status Partially Met   met for current   Target Date 08/14/20      PT LONG TERM GOAL #2   Title Patient to demonstrate R knee AROM/PROM 0-120 degrees.    Time 4    Period Weeks    Status Achieved   AROM 0-124 degrees, PROM 0-126 degrees     PT LONG TERM GOAL #3   Title Patient to demonstrate symmetrical step length, weight shift, and knee flexion with ambulation.    Time 4    Period Weeks    Status Achieved      PT LONG TERM GOAL #4   Title Patient to demonstrate good quad control and report no pain when navigating 13 steps with 1 handrail as needed.    Time 4    Period Weeks    Status Partially Met   poor quad control and instability demonstrated when descending   Target Date 08/14/20                 Plan - 07/17/20 0940    Clinical Impression Statement Patient reporting 85-90% improvement in R knee. However, still notes some hesitation when walking down the stairs.  Denies questions on HEP and reports good compliance. Patient now able to reach R knee 0-126 AROM degrees, -1-132 PROM degrees. Strength testing revealed good overall strength in all planes with resistance. Patient  is demonstrating improved gait pattern, now with only mild lateral trunk lean remaining. However, patient with forward trunk lean when ascending stairs d/t R LE weakness and poor stability and eccentric control when descending. Reviewed stair stepping activities with patient requiring cueing to avoid extensor thrust upon step up and correction of hesitation to bend knee upon step down. Much improved form after instruction. Patient with palpable cavitation in the R patella with step downs but pain-free. Updated HEP with exercises that were well-tolerated today. Patient reported understanding and without complaints at end of session. Patient has made good progress towards goals, with exception of stair climbing. Would benefit from additional skilled PT services 1x/week for 4 weeks to address remaining goals.    Comorbidities HTN, dysthymia, toe surgery    PT Treatment/Interventions ADLs/Self Care Home Management;Cryotherapy;Electrical Stimulation;Moist Heat;Balance training;Therapeutic exercise;Therapeutic activities;Functional mobility training;Stair training;Gait training;Ultrasound;Neuromuscular re-education;Patient/family education;Manual techniques;Taping;Energy conservation;Dry needling;Passive range of motion;Scar mobilization    PT Next Visit Plan quad strengthening; stairs    Consulted and Agree with Plan of Care Patient           Patient will benefit from skilled therapeutic intervention in order to improve the following deficits and impairments:  Increased edema, Decreased scar mobility, Decreased activity tolerance, Pain, Increased fascial restricitons, Decreased strength, Decreased balance, Difficulty walking, Increased muscle spasms, Improper body mechanics, Decreased range of motion, Postural dysfunction, Impaired flexibility  Visit Diagnosis: Stiffness of right knee, not elsewhere classified  Acute pain of right knee  Pain in thoracic spine  Other abnormalities of gait and  mobility     Problem List Patient Active Problem List   Diagnosis Date Noted  . Current smoker 10/06/2016  . Depression 07/25/2011  . Hypertension 07/25/2011     Janene Harvey, PT, DPT 07/17/20 9:42 AM   [facility] [address]  [place], [place], [address] Phone: [phone]   Fax:  [phone]  Name: BELL CAI MRN: [id number] Date of Birth: 1965/11/16

## 2020-07-24 ENCOUNTER — Ambulatory Visit: Payer: Medicaid Other | Attending: Orthopedic Surgery

## 2020-07-24 ENCOUNTER — Other Ambulatory Visit: Payer: Self-pay

## 2020-07-24 DIAGNOSIS — M25561 Pain in right knee: Secondary | ICD-10-CM

## 2020-07-24 DIAGNOSIS — M25661 Stiffness of right knee, not elsewhere classified: Secondary | ICD-10-CM

## 2020-07-24 DIAGNOSIS — M546 Pain in thoracic spine: Secondary | ICD-10-CM | POA: Diagnosis not present

## 2020-07-24 DIAGNOSIS — R2689 Other abnormalities of gait and mobility: Secondary | ICD-10-CM

## 2020-07-24 NOTE — Therapy (Signed)
[facility] [address]  [address] [place], [place], [address] Phone: [phone]   Fax:  [phone]  Physical Therapy Treatment  Patient Details  Name: Stephen Dudley MRN: [id number] Date of Birth: [birth year] Referring Provider (PT): Margarita Rana, MD   Encounter Date: 07/24/2020   PT End of Session - 07/24/20 0850    Visit Number 7    Number of Visits 10    Date for PT Re-Evaluation 08/14/20    Authorization Type Medicaid [employer]    PT Start Time 0845    PT Stop Time 0923    PT Time Calculation (min) 38 min    Activity Tolerance Patient tolerated treatment well    Behavior During Therapy [facility] for tasks assessed/performed           Past Medical History:  Diagnosis Date  . Closed patellar sleeve fracture of right knee   . Dysthymia   . Hypertension     Past Surgical History:  Procedure Laterality Date  . MANDIBLE FRACTURE SURGERY  25 yrs ago  . ORIF PATELLA Right 04/21/2020   Procedure: OPEN REDUCTION INTERNAL (ORIF) FIXATION PATELLA;  Surgeon: Sheral Apley, MD;  Location: [facility];  Service: Orthopedics;  Laterality: Right;  . TOE SURGERY  20 yrs ago    There were no vitals filed for this visit.   Subjective Assessment - 07/24/20 0848    Subjective Pt. reports he feels stairs are getting easier.    Pertinent History HTN, dysthymia, toe surgery    Diagnostic tests 04/09/20 R knee: Comminuted distracted mid patellar fracture; no acute abnormalities on chest, thoracic, or lumbar xrays on 04/09/20    Patient Stated Goals work on knee motion    Currently in Pain? No/denies    Pain Score 4     Pain Location Knee    Pain Orientation Right    Pain Descriptors / Indicators Sore    Multiple Pain Sites No                             [facility] Adult PT Treatment/Exercise - 07/24/20 0001      Knee/Hip Exercises: Stretches   Gastroc Stretch Right;1 rep;30 seconds     Gastroc Stretch Limitations runners stretch into wall       Knee/Hip Exercises: Aerobic   Recumbent Bike L2 x 6 min      Knee/Hip Exercises: Machines for Strengthening   Cybex Knee Flexion B LEs: 35# x 15 reps       Knee/Hip Exercises: Standing   Forward Lunges Right;Left;2 sets;5 reps    Forward Lunges Limitations TRX     Forward Step Up Right;15 reps;Step Height: 8";Hand Hold: 1    Step Down Right;10 reps;Step Height: 6";Hand Hold: 1    Wall Squat 15 reps;3 seconds    Wall Squat Limitations cues for proper depth       Knee/Hip Exercises: Supine   Bridges Both;10 reps;Right;Left    Bridges Limitations + alternating march x 1 each at top                    PT Short Term Goals - 07/17/20 0850      PT SHORT TERM GOAL #1   Title Patient to be independent with initial HEP.    Time 1    Period Weeks    Status Achieved    Target Date 06/12/20  PT Long Term Goals - 07/17/20 0850      PT LONG TERM GOAL #1   Title Patient to be independent with advanced HEP.    Time 4    Period Weeks    Status Partially Met   met for current   Target Date 08/14/20      PT LONG TERM GOAL #2   Title Patient to demonstrate R knee AROM/PROM 0-120 degrees.    Time 4    Period Weeks    Status Achieved   AROM 0-124 degrees, PROM 0-126 degrees     PT LONG TERM GOAL #3   Title Patient to demonstrate symmetrical step length, weight shift, and knee flexion with ambulation.    Time 4    Period Weeks    Status Achieved      PT LONG TERM GOAL #4   Title Patient to demonstrate good quad control and report no pain when navigating 13 steps with 1 handrail as needed.    Time 4    Period Weeks    Status Partially Met   poor quad control and instability demonstrated when descending   Target Date 08/14/20                 Plan - 07/24/20 0851    Clinical Impression Statement [name] with no new complaints.  Reports stairs are getting easier for him.  Progressed eccentric  quad and lateral hip strengthening activities for improved ease navigating stairs progressing toward LTG #4.  Pt. to see MD for f/u on 10/13 thus will plan for PN next session.    Comorbidities HTN, dysthymia, toe surgery    Rehab Potential Good    PT Frequency 1x / week    PT Duration 4 weeks    PT Treatment/Interventions ADLs/Self Care Home Management;Cryotherapy;Electrical Stimulation;Moist Heat;Balance training;Therapeutic exercise;Therapeutic activities;Functional mobility training;Stair training;Gait training;Ultrasound;Neuromuscular re-education;Patient/family education;Manual techniques;Taping;Energy conservation;Dry needling;Passive range of motion;Scar mobilization    PT Next Visit Plan quad strengthening; stairs    Consulted and Agree with Plan of Care Patient           Patient will benefit from skilled therapeutic intervention in order to improve the following deficits and impairments:  Increased edema, Decreased scar mobility, Decreased activity tolerance, Pain, Increased fascial restricitons, Decreased strength, Decreased balance, Difficulty walking, Increased muscle spasms, Improper body mechanics, Decreased range of motion, Postural dysfunction, Impaired flexibility  Visit Diagnosis: Stiffness of right knee, not elsewhere classified  Acute pain of right knee  Pain in thoracic spine  Other abnormalities of gait and mobility     Problem List Patient Active Problem List   Diagnosis Date Noted  . Current smoker 10/06/2016  . Depression 07/25/2011  . Hypertension 07/25/2011    Bess Harvest, PTA 07/24/20 9:31 AM   [facility] [address]  [place], [place], [address] Phone: [phone]   Fax:  [phone]  Name: Stephen Dudley MRN: [id number] Date of Birth: 22-Mar-1966

## 2020-08-03 ENCOUNTER — Other Ambulatory Visit: Payer: Self-pay

## 2020-08-03 ENCOUNTER — Ambulatory Visit: Payer: Medicaid Other

## 2020-08-03 DIAGNOSIS — R2689 Other abnormalities of gait and mobility: Secondary | ICD-10-CM | POA: Diagnosis not present

## 2020-08-03 DIAGNOSIS — M25661 Stiffness of right knee, not elsewhere classified: Secondary | ICD-10-CM | POA: Diagnosis not present

## 2020-08-03 DIAGNOSIS — M546 Pain in thoracic spine: Secondary | ICD-10-CM | POA: Diagnosis not present

## 2020-08-03 DIAGNOSIS — M25561 Pain in right knee: Secondary | ICD-10-CM

## 2020-08-03 NOTE — Therapy (Signed)
[facility] [address]  [place], [place], [address] Phone: [phone]   Fax:  [phone]  Physical Therapy Treatment  Patient Details  Name: Stephen Dudley MRN: [id number] Date of Birth: December 14, 1965 Referring Provider (PT): Edmonia Lynch, MD   Encounter Date: 08/03/2020   PT End of Session - 08/03/20 1321    Visit Number 8    Number of Visits 10    Date for PT Re-Evaluation 08/14/20    Authorization Type Medicaid [employer]    PT Start Time 5643    PT Stop Time 1358    PT Time Calculation (min) 43 min    Activity Tolerance Patient tolerated treatment well    Behavior During Therapy [facility] for tasks assessed/performed           Past Medical History:  Diagnosis Date  . Closed patellar sleeve fracture of right knee   . Dysthymia   . Hypertension     Past Surgical History:  Procedure Laterality Date  . MANDIBLE FRACTURE SURGERY  25 yrs ago  . ORIF PATELLA Right 04/21/2020   Procedure: OPEN REDUCTION INTERNAL (ORIF) FIXATION PATELLA;  Surgeon: Renette Butters, MD;  Location: [facility];  Service: Orthopedics;  Laterality: Right;  . TOE SURGERY  20 yrs ago    There were no vitals filed for this visit.   Subjective Assessment - 08/03/20 1317    Subjective Pt. noting intermittent R knee pain "on and off, but it's normal for me".    Pertinent History HTN, dysthymia, toe surgery    Diagnostic tests 04/09/20 R knee: Comminuted distracted mid patellar fracture; no acute abnormalities on chest, thoracic, or lumbar xrays on 04/09/20    Patient Stated Goals work on knee motion    Currently in Pain? No/denies    Pain Score 0-No pain   intermittently "burning" pain up to 2-3/10 at most intermittently without known trigger throughout day   Pain Location Knee    Pain Orientation Right    Pain Descriptors / Indicators Burning    Pain Type Acute pain    Multiple Pain Sites No               [facility] PT Assessment - 08/03/20 0001      AROM   AROM Assessment Site Knee    Right/Left Knee Right    Right Knee Extension 0    Right Knee Flexion 134      PROM   PROM Assessment Site Knee    Right/Left Knee Right    Right Knee Extension -1    Right Knee Flexion 137      Strength   Strength Assessment Site Hip;Knee;Ankle    Right/Left Hip Right;Left    Right Hip Flexion 4+/5    Right Hip ABduction 4+/5    Right Hip ADduction 4+/5    Left Hip Flexion 4+/5    Left Hip ABduction 4+/5    Left Hip ADduction 4+/5    Right/Left Knee Right;Left    Right Knee Flexion 5/5    Right Knee Extension 4+/5    Left Knee Flexion 5/5    Left Knee Extension 5/5    Right/Left Ankle Right;Left    Right Ankle Dorsiflexion 4+/5    Right Ankle Plantar Flexion 4+/5    Left Ankle Dorsiflexion 4+/5    Left Ankle Plantar Flexion 4+/5  [facility] Adult PT Treatment/Exercise - 08/03/20 0001      Ambulation/Gait   Stairs Yes    Stairs Assistance 5: Supervision    Stairs Assistance Details (indicate cue type and reason) cues for slow eccentric lowering     Stair Management Technique Alternating pattern;No rails    Number of Stairs 13    Height of Stairs 8    Gait Comments Pt. with visible R quad and lateral hip weakness       Knee/Hip Exercises: Aerobic   Recumbent Bike L2 x 6 min      Knee/Hip Exercises: Machines for Strengthening   Cybex Knee Flexion B LEs: 35# x 20 reps       Knee/Hip Exercises: Standing   Step Down Right;10 reps;Step Height: 6";Hand Hold: 1    Step Down Limitations lateral step-down       Knee/Hip Exercises: Supine   Bridges Both;15 reps;2 sets    Bridges Limitations 5" hold       Knee/Hip Exercises: Sidelying   Hip ABduction 10 reps;Right;Strengthening    Hip ABduction Limitations 2#      Knee/Hip Exercises: Prone   Hip Extension Right;10 reps;Strengthening                    PT Short Term Goals - 07/17/20 0850       PT SHORT TERM GOAL #1   Title Patient to be independent with initial HEP.    Time 1    Period Weeks    Status Achieved    Target Date 06/12/20             PT Long Term Goals - 07/17/20 0850      PT LONG TERM GOAL #1   Title Patient to be independent with advanced HEP.    Time 4    Period Weeks    Status Partially Met   met for current   Target Date 08/14/20      PT LONG TERM GOAL #2   Title Patient to demonstrate R knee AROM/PROM 0-120 degrees.    Time 4    Period Weeks    Status Achieved   AROM 0-124 degrees, PROM 0-126 degrees     PT LONG TERM GOAL #3   Title Patient to demonstrate symmetrical step length, weight shift, and knee flexion with ambulation.    Time 4    Period Weeks    Status Achieved      PT LONG TERM GOAL #4   Title Patient to demonstrate good quad control and report no pain when navigating 13 steps with 1 handrail as needed.    Time 4    Period Weeks    Status Partially Met   poor quad control and instability demonstrated when descending   Target Date 08/14/20                 Plan - 08/03/20 1321    Clinical Impression Statement Pt. doing well with no new complaints.  Notes mild R knee "burning" pain intermittently throughout day without known trigger.  Has now began to walk without R knee brace some outside the home.  tolerated quad eccentric strengthening activities well today without increased pain.  Still with some lateral hip and R quad weakness noted descending stairs.  Has demonstrated some improved R LE strength with MMT today and has now partially met or met all LTGs.  R knee AROM/PROM now full.  Pt. with MD f/u on Wednesday.  Comorbidities HTN, dysthymia, toe surgery    Rehab Potential Good    PT Frequency 1x / week    PT Duration 4 weeks    PT Treatment/Interventions ADLs/Self Care Home Management;Cryotherapy;Electrical Stimulation;Moist Heat;Balance training;Therapeutic exercise;Therapeutic activities;Functional mobility  training;Stair training;Gait training;Ultrasound;Neuromuscular re-education;Patient/family education;Manual techniques;Taping;Energy conservation;Dry needling;Passive range of motion;Scar mobilization    PT Next Visit Plan quad strengthening; stairs    Consulted and Agree with Plan of Care Patient           Patient will benefit from skilled therapeutic intervention in order to improve the following deficits and impairments:  Increased edema, Decreased scar mobility, Decreased activity tolerance, Pain, Increased fascial restricitons, Decreased strength, Decreased balance, Difficulty walking, Increased muscle spasms, Improper body mechanics, Decreased range of motion, Postural dysfunction, Impaired flexibility  Visit Diagnosis: Stiffness of right knee, not elsewhere classified  Acute pain of right knee  Pain in thoracic spine  Other abnormalities of gait and mobility     Problem List Patient Active Problem List   Diagnosis Date Noted  . Current smoker 10/06/2016  . Depression 07/25/2011  . Hypertension 07/25/2011    Bess Harvest, PTA 08/03/20 2:40 PM   [facility] [address]  [place], [place], [address] Phone: [phone]   Fax:  [phone]  Name: Stephen Dudley MRN: [id number] Date of Birth: [birth year]

## 2020-08-05 ENCOUNTER — Other Ambulatory Visit: Payer: Self-pay | Admitting: Family Medicine

## 2020-08-05 DIAGNOSIS — F32A Depression, unspecified: Secondary | ICD-10-CM

## 2020-08-05 DIAGNOSIS — I10 Essential (primary) hypertension: Secondary | ICD-10-CM

## 2020-08-05 DIAGNOSIS — M23601 Other spontaneous disruption of unspecified ligament of right knee: Secondary | ICD-10-CM | POA: Diagnosis not present

## 2020-08-05 NOTE — Telephone Encounter (Signed)
[facility] is requesting to fill pt paxil. Please advise K[facility] 

## 2020-08-06 NOTE — Telephone Encounter (Signed)
Med check appt

## 2020-08-06 NOTE — Telephone Encounter (Signed)
Appt made. KH 

## 2020-08-10 ENCOUNTER — Other Ambulatory Visit: Payer: Self-pay

## 2020-08-10 ENCOUNTER — Encounter: Payer: Self-pay | Admitting: Physical Therapy

## 2020-08-10 ENCOUNTER — Ambulatory Visit: Payer: Medicaid Other | Admitting: Physical Therapy

## 2020-08-10 DIAGNOSIS — M25661 Stiffness of right knee, not elsewhere classified: Secondary | ICD-10-CM

## 2020-08-10 DIAGNOSIS — M25561 Pain in right knee: Secondary | ICD-10-CM | POA: Diagnosis not present

## 2020-08-10 DIAGNOSIS — R2689 Other abnormalities of gait and mobility: Secondary | ICD-10-CM | POA: Diagnosis not present

## 2020-08-10 DIAGNOSIS — M546 Pain in thoracic spine: Secondary | ICD-10-CM | POA: Diagnosis not present
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
## 2020-09-02 ENCOUNTER — Other Ambulatory Visit: Payer: Self-pay

## 2020-09-02 ENCOUNTER — Ambulatory Visit: Payer: Medicaid Other | Admitting: Family Medicine

## 2020-09-02 ENCOUNTER — Encounter: Payer: Self-pay | Admitting: Family Medicine

## 2020-09-02 VITALS — BP 148/90 | HR 69 | Temp 96.5°F | Wt 150.2 lb

## 2020-09-02 DIAGNOSIS — Z13220 Encounter for screening for lipoid disorders: Secondary | ICD-10-CM | POA: Diagnosis not present

## 2020-09-02 DIAGNOSIS — F32A Depression, unspecified: Secondary | ICD-10-CM

## 2020-09-02 DIAGNOSIS — R7309 Other abnormal glucose: Secondary | ICD-10-CM | POA: Diagnosis not present

## 2020-09-02 DIAGNOSIS — Z23 Encounter for immunization: Secondary | ICD-10-CM

## 2020-09-02 DIAGNOSIS — I10 Essential (primary) hypertension: Secondary | ICD-10-CM | POA: Diagnosis not present

## 2020-09-02 LAB — LIPID PANEL

## 2020-09-02 MED ORDER — BUPROPION HCL ER (SR) 100 MG PO TB12: 100.0000 mg | ORAL_TABLET | Freq: Two times a day (BID) | ORAL | 1 refills | Status: DC
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
## 2020-09-03 ENCOUNTER — Telehealth: Payer: Self-pay

## 2020-09-03 LAB — CBC WITH DIFFERENTIAL/PLATELET
Basophils Absolute: 0.1 10*3/uL (ref 0.0–0.2)
Basos: 1 %
EOS (ABSOLUTE): 0.2 10*3/uL (ref 0.0–0.4)
Eos: 2 %
Hematocrit: 45.8 % (ref 37.5–51.0)
Hemoglobin: 15.2 g/dL (ref 13.0–17.7)
Immature Grans (Abs): 0.1 10*3/uL (ref 0.0–0.1)
Immature Granulocytes: 1 %
Lymphocytes Absolute: 2.2 10*3/uL (ref 0.7–3.1)
Lymphs: 23 %
MCH: 30 pg (ref 26.6–33.0)
MCHC: 33.2 g/dL (ref 31.5–35.7)
MCV: 90 fL (ref 79–97)
Monocytes Absolute: 0.8 10*3/uL (ref 0.1–0.9)
Monocytes: 9 %
Neutrophils Absolute: 6.1 10*3/uL (ref 1.4–7.0)
Neutrophils: 64 %
Platelets: 265 10*3/uL (ref 150–450)
RBC: 5.07 x10E6/uL (ref 4.14–5.80)
RDW: 13.7 % (ref 11.6–15.4)
WBC: 9.4 10*3/uL (ref 3.4–10.8)

## 2020-09-03 LAB — COMPREHENSIVE METABOLIC PANEL
ALT: 15 IU/L (ref 0–44)
AST: 19 IU/L (ref 0–40)
Albumin/Globulin Ratio: 2 (ref 1.2–2.2)
Albumin: 4.6 g/dL (ref 3.8–4.9)
Alkaline Phosphatase: 81 IU/L (ref 44–121)
BUN/Creatinine Ratio: 12 (ref 9–20)
BUN: 9 mg/dL (ref 6–24)
Bilirubin Total: 0.4 mg/dL (ref 0.0–1.2)
CO2: 27 mmol/L (ref 20–29)
Calcium: 9.4 mg/dL (ref 8.7–10.2)
Chloride: 100 mmol/L (ref 96–106)
Creatinine, Ser: 0.74 mg/dL — ABNORMAL LOW (ref 0.76–1.27)
GFR calc Af Amer: 119 mL/min/{1.73_m2} (ref >59)
GFR calc non Af Amer: 103 mL/min/{1.73_m2} (ref >59)
Globulin, Total: 2.3 g/dL (ref 1.5–4.5)
Glucose: 108 mg/dL — ABNORMAL HIGH (ref 65–99)
Potassium: 4.6 mmol/L (ref 3.5–5.2)
Sodium: 136 mmol/L (ref 134–144)
Total Protein: 6.9 g/dL (ref 6.0–8.5)

## 2020-09-03 LAB — LIPID PANEL
Chol/HDL Ratio: 3.4 ratio (ref 0.0–5.0)
Cholesterol, Total: 216 mg/dL — ABNORMAL HIGH (ref 100–199)
HDL: 63 mg/dL (ref >39)
LDL Chol Calc (NIH): 140 mg/dL — ABNORMAL HIGH (ref 0–99)
Triglycerides: 75 mg/dL (ref 0–149)
VLDL Cholesterol Cal: 13 mg/dL (ref 5–40)

## 2020-09-03 NOTE — Telephone Encounter (Signed)
Called pharmacy and changed to once a day #30/1

## 2020-09-03 NOTE — Telephone Encounter (Signed)
Once a day is fine

## 2020-09-03 NOTE — Telephone Encounter (Signed)
Pharmacy said they had to order & would be tomorrow before comes in.  Richard informed

## 2020-09-03 NOTE — Telephone Encounter (Addendum)
Wellbutrin was sent to the wrong pharmacy & I called and cancelled & sent to [facility] per Richard's request.  Also quantity was for #30 & pharmacist said should be #60 for BID. Please confirm if quantity should be #60 for 30.

## 2020-09-03 NOTE — Telephone Encounter (Signed)
Caregiver informed.

## 2020-09-04 LAB — HGB A1C W/O EAG: Hgb A1c MFr Bld: 5.6 % (ref 4.8–5.6)

## 2020-09-04 LAB — SPECIMEN STATUS REPORT

## 2020-10-01 ENCOUNTER — Ambulatory Visit: Payer: Medicaid Other | Admitting: Family Medicine

## 2020-10-12 ENCOUNTER — Other Ambulatory Visit: Payer: Self-pay

## 2020-10-12 ENCOUNTER — Encounter: Payer: Self-pay | Admitting: Family Medicine

## 2020-10-12 ENCOUNTER — Ambulatory Visit: Payer: Medicaid Other | Admitting: Family Medicine

## 2020-10-12 VITALS — BP 116/80 | HR 71 | Temp 97.2°F | Wt 154.8 lb

## 2020-10-12 DIAGNOSIS — F32A Depression, unspecified: Secondary | ICD-10-CM

## 2020-10-12 DIAGNOSIS — I10 Essential (primary) hypertension: Secondary | ICD-10-CM | POA: Diagnosis not present

## 2020-10-12 MED ORDER — BUPROPION HCL ER (SR) 150 MG PO TB12: 150.0000 mg | ORAL_TABLET | Freq: Two times a day (BID) | ORAL | 1 refills | Status: DC

## 2020-10-12 NOTE — Progress Notes (Signed)
   Subjective:    Patient ID: Stephen Dudley, male    DOB: 1965/12/03, 54 y.o.   MRN: [id number]  HPI He is here for recheck on his depression and also his blood pressure.  He states that he is maybe 50% better on the addition of bupropion.   Review of Systems     Objective:   Physical Exam Alert and in no distress.  Blood pressure is recorded.       Assessment & Plan:  Depression, unspecified depression type - Plan: buPROPion (WELLBUTRIN SR) 150 MG 12 hr tablet  Essential hypertension I will increase his bupropion.  He will set up for virtual visit in 1 month. Blood pressure looks good.  He is to continue on his present medication regimen.

## 2020-11-06 ENCOUNTER — Other Ambulatory Visit: Payer: Self-pay | Admitting: Family Medicine

## 2020-11-06 DIAGNOSIS — F32A Depression, unspecified: Secondary | ICD-10-CM

## 2020-11-06 NOTE — Telephone Encounter (Signed)
[facility] is requesting to fill pt wellbutrin. Please advise KH 

## 2020-11-09 ENCOUNTER — Other Ambulatory Visit: Payer: Self-pay | Admitting: Family Medicine

## 2020-11-09 NOTE — Telephone Encounter (Signed)
[facility] is requesting to fill pt wellbutrin. Please advise KH 

## 2020-11-16 ENCOUNTER — Encounter: Payer: Self-pay | Admitting: Family Medicine

## 2020-11-16 ENCOUNTER — Other Ambulatory Visit: Payer: Self-pay

## 2020-11-16 ENCOUNTER — Telehealth (INDEPENDENT_AMBULATORY_CARE_PROVIDER_SITE_OTHER): Payer: Medicaid Other | Admitting: Family Medicine

## 2020-11-16 VITALS — Wt 154.0 lb

## 2020-11-16 DIAGNOSIS — F32A Depression, unspecified: Secondary | ICD-10-CM

## 2020-11-16 MED ORDER — PAROXETINE HCL 20 MG PO TABS: ORAL_TABLET | ORAL | 1 refills | Status: DC

## 2020-11-16 MED ORDER — BUPROPION HCL ER (SR) 150 MG PO TB12: ORAL_TABLET | ORAL | 1 refills | Status: DC

## 2020-11-16 NOTE — Progress Notes (Signed)
   Subjective:    Patient ID: Stephen Dudley, male    DOB: [birth year], 55 y.o.   MRN: [id number]  HPI I connected with  Stephen Dudley on 11/16/20 by a video enabled telemedicine application and verified that I am speaking with the correct person using two identifiers.  Caregility used.  Me: Office bpatient: Home I discussed the limitations of evaluation and management by telemedicine. The patient expressed understanding and agreed to proceed. He is now taking Paxil 10 mg and Wellbutrin 150 mg.  He states that he has now 95% back to normal. Review of Systems     Objective:   Physical Exam Alert and in no distress with appropriate affect       Assessment & Plan:  Depression, unspecified depression type - Plan: PARoxetine (PAXIL) 20 MG tablet, buPROPion (WELLBUTRIN SR) 150 MG 12 hr tablet 10 minutes spent today in reviewing his medical record, documentation and counseling

## 2020-11-16 NOTE — Addendum Note (Signed)
Addended by: Ronnald Nian on: 11/16/2020 09:25 AM   Modules accepted: Orders

## 2020-11-16 NOTE — Addendum Note (Signed)
Addended by: Ronnald Nian on: 11/16/2020 01:53 PM   Modules accepted: Orders

## 2020-11-16 NOTE — Addendum Note (Signed)
Addended by: Tamila Gaulin C on: 11/16/2020 01:56 PM   Modules accepted: Orders  

## 2020-11-16 NOTE — Addendum Note (Signed)
Addended by: Ronnald Nian on: 11/16/2020 09:33 AM   Modules accepted: Orders

## 2020-12-19 ENCOUNTER — Other Ambulatory Visit: Payer: Self-pay | Admitting: Family Medicine

## 2020-12-19 DIAGNOSIS — F32A Depression, unspecified: Secondary | ICD-10-CM

## 2020-12-21 NOTE — Telephone Encounter (Signed)
Is this okay to refill? 

## 2021-03-02 ENCOUNTER — Other Ambulatory Visit: Payer: Self-pay

## 2021-03-02 ENCOUNTER — Encounter: Payer: Self-pay | Admitting: Family Medicine

## 2021-03-02 ENCOUNTER — Ambulatory Visit: Payer: Medicaid Other | Admitting: Family Medicine

## 2021-03-02 VITALS — BP 150/100 | HR 76 | Temp 97.5°F | Ht 62.0 in | Wt 154.2 lb

## 2021-03-02 DIAGNOSIS — Z1211 Encounter for screening for malignant neoplasm of colon: Secondary | ICD-10-CM

## 2021-03-02 DIAGNOSIS — F32A Depression, unspecified: Secondary | ICD-10-CM | POA: Diagnosis not present

## 2021-03-02 NOTE — Progress Notes (Signed)
   Subjective:    Patient ID: Stephen Dudley, male    DOB: 07-16-1966, 55 y.o.   MRN: [id number]  HPI He is here for consult concerning increased difficulty with anxiety.  Review of his record indicated he was doing quite nicely on his Wellbutrin and Paxil.  He notes that approximately 3 weeks ago when he picked up his new prescription, after that increased anxiety occurred to the point that he became quite mean on several occasions.   Review of Systems     Objective:   Physical Exam Alert and in no distress otherwise not examined.  He has appropriate affect.       Assessment & Plan:  Depression, unspecified depression type  Screening for colon cancer - Plan: Cologuard I suspect that the prescription was filled with a different generic product.  He is to check with the pharmacy and get placed back on the previous product otherwise I will call in a d.a.w. prescription.

## 2021-03-05 ENCOUNTER — Telehealth: Payer: Self-pay

## 2021-03-05 NOTE — Telephone Encounter (Signed)
Called [facility] spoke with pharmacist and he states Wellbutrin hasn't been filled since 1/22, is due for fill now and no issues with filling it, ran test claim with medicaid and went thru.  Also checked on manufacture and pt had [place] in December and January.  Called Richard and he is switching to [name] and will call me on Monday if he has any issues.

## 2021-03-29 ENCOUNTER — Telehealth: Payer: Self-pay | Admitting: Family Medicine

## 2021-03-29 DIAGNOSIS — F32A Depression, unspecified: Secondary | ICD-10-CM

## 2021-03-29 DIAGNOSIS — I10 Essential (primary) hypertension: Secondary | ICD-10-CM

## 2021-03-29 NOTE — Telephone Encounter (Signed)
Pharmacy called and pt switched to [facility] pt needs  paxil and lisinpril changed to them  Please send refill to   [facility] PHONE NUMBER IS [phone]

## 2021-03-30 MED ORDER — PAROXETINE HCL 20 MG PO TABS: 20.0000 mg | ORAL_TABLET | Freq: Every day | ORAL | 1 refills | Status: DC

## 2021-03-30 MED ORDER — LISINOPRIL-HYDROCHLOROTHIAZIDE 20-12.5 MG PO TABS: 1.0000 | ORAL_TABLET | Freq: Every day | ORAL | 3 refills | Status: DC
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
## 2021-09-22 ENCOUNTER — Other Ambulatory Visit: Payer: Self-pay | Admitting: Family Medicine

## 2021-09-22 DIAGNOSIS — F32A Depression, unspecified: Secondary | ICD-10-CM

## 2021-09-22 NOTE — Telephone Encounter (Signed)
done

## 2021-12-13 ENCOUNTER — Other Ambulatory Visit: Payer: Self-pay | Admitting: Medical

## 2021-12-13 DIAGNOSIS — F32A Depression, unspecified: Secondary | ICD-10-CM

## 2021-12-16 ENCOUNTER — Other Ambulatory Visit: Payer: Self-pay | Admitting: Family Medicine

## 2021-12-16 ENCOUNTER — Encounter: Payer: Self-pay | Admitting: Family Medicine

## 2021-12-16 DIAGNOSIS — F32A Depression, unspecified: Secondary | ICD-10-CM

## 2021-12-16 NOTE — Telephone Encounter (Signed)
[name] is requesting to fill pt wellbutrin. Please advise kh

## 2022-01-24 ENCOUNTER — Other Ambulatory Visit: Payer: Self-pay | Admitting: Family Medicine

## 2022-01-24 DIAGNOSIS — F32A Depression, unspecified: Secondary | ICD-10-CM

## 2022-01-24 NOTE — Telephone Encounter (Signed)
[name] is requesting to fill pt paxil. Please advise KH ?

## 2022-01-25 NOTE — Telephone Encounter (Signed)
Lvm for pt to call back to make an appointment. KH ?

## 2022-01-25 NOTE — Telephone Encounter (Signed)
Lvm for pt to call back to make and appointment. KH ?

## 2022-01-26 ENCOUNTER — Telehealth: Payer: Self-pay | Admitting: Family Medicine

## 2022-01-26 DIAGNOSIS — F32A Depression, unspecified: Secondary | ICD-10-CM

## 2022-01-26 MED ORDER — PAROXETINE HCL 20 MG PO TABS: 20.0000 mg | ORAL_TABLET | Freq: Every day | ORAL | 1 refills | Status: DC

## 2022-01-26 MED ORDER — BUPROPION HCL ER (SR) 150 MG PO TB12: ORAL_TABLET | ORAL | 1 refills | Status: DC

## 2022-01-26 NOTE — Telephone Encounter (Signed)
Pt called and made a wellness visit for 06/22/2022 ?Needs refill for paxil and wellburtrin please send to the [facility], [place] - [phone] ?

## 2022-01-26 NOTE — Telephone Encounter (Signed)
Pt is schedule in august. Please advise if this can be filled. KH ?

## 2022-04-05 ENCOUNTER — Other Ambulatory Visit: Payer: Self-pay | Admitting: Family Medicine

## 2022-04-05 DIAGNOSIS — I10 Essential (primary) hypertension: Secondary | ICD-10-CM

## 2022-06-22 ENCOUNTER — Ambulatory Visit: Payer: Medicaid Other | Admitting: Family Medicine

## 2022-06-22 ENCOUNTER — Encounter: Payer: Self-pay | Admitting: Family Medicine

## 2022-06-22 VITALS — BP 130/86 | HR 67 | Temp 97.7°F | Ht 60.5 in | Wt 152.8 lb

## 2022-06-22 DIAGNOSIS — F17200 Nicotine dependence, unspecified, uncomplicated: Secondary | ICD-10-CM

## 2022-06-22 DIAGNOSIS — F32A Depression, unspecified: Secondary | ICD-10-CM | POA: Diagnosis not present

## 2022-06-22 DIAGNOSIS — Z1211 Encounter for screening for malignant neoplasm of colon: Secondary | ICD-10-CM

## 2022-06-22 DIAGNOSIS — R7309 Other abnormal glucose: Secondary | ICD-10-CM | POA: Diagnosis not present

## 2022-06-22 DIAGNOSIS — Z23 Encounter for immunization: Secondary | ICD-10-CM | POA: Diagnosis not present

## 2022-06-22 DIAGNOSIS — I10 Essential (primary) hypertension: Secondary | ICD-10-CM

## 2022-06-22 DIAGNOSIS — Z13220 Encounter for screening for lipoid disorders: Secondary | ICD-10-CM

## 2022-06-22 LAB — LIPID PANEL

## 2022-06-22 LAB — POCT GLYCOSYLATED HEMOGLOBIN (HGB A1C): Hemoglobin A1C: 5.3 % (ref 4.0–5.6)

## 2022-06-22 MED ORDER — LISINOPRIL-HYDROCHLOROTHIAZIDE 20-12.5 MG PO TABS: ORAL_TABLET | ORAL | 3 refills | Status: DC

## 2022-06-22 MED ORDER — LISINOPRIL-HYDROCHLOROTHIAZIDE 20-12.5 MG PO TABS
ORAL_TABLET | ORAL | 3 refills | Status: DC
Start: 2022-06-22 — End: 2022-06-22

## 2022-06-22 MED ORDER — PAROXETINE HCL 20 MG PO TABS: 20.0000 mg | ORAL_TABLET | Freq: Every day | ORAL | 1 refills | Status: DC

## 2022-06-22 NOTE — Progress Notes (Signed)
Complete Med check exam  Patient: Stephen Dudley   DOB: 05/04/1966   56 y.o. Male  MRN: [id number]  Subjective:    Stephen Dudley is a 56 y.o. male who presents today for a complete Med check exam. He reports consuming a general diet. The patient has a physically strenuous job, but has no regular exercise apart from work.  He generally feels well. He reports sleeping well. He does have additional problems to discuss today.  He did stop taking his Wellbutrin stating he thought it made him more anxious.  He has been off of this for 3 weeks and feels that he is much better.  He does still smoke marijuana and has done that for several years.  He does work part-time doing tree work.  He continues on lisinopril/HCTZ.  He does have a history of elevated blood sugar.   Most recent fall risk assessment:     No data to display           Most recent depression screenings:    03/02/2021    9:02 AM 10/12/2020    8:12 AM  PHQ 2/9 Scores  PHQ - 2 Score 0 0      Patient Active Problem List   Diagnosis Date Noted   Current smoker 10/06/2016   Depression 07/25/2011   Hypertension 07/25/2011   Past Medical History:  Diagnosis Date   Closed patellar sleeve fracture of right knee    Dysthymia    Hypertension    Past Surgical History:  Procedure Laterality Date   MANDIBLE FRACTURE SURGERY  25 yrs ago   ORIF PATELLA Right 04/21/2020   Procedure: OPEN REDUCTION INTERNAL (ORIF) FIXATION PATELLA;  Surgeon: Sheral Apley, MD;  Location: [facility] [place];  Service: Orthopedics;  Laterality: Right;   TOE SURGERY  20 yrs ago   Social History   Tobacco Use   Smoking status: Never   Smokeless tobacco: Never  Vaping Use   Vaping Use: Never used  Substance Use Topics   Alcohol use: Yes    Alcohol/week: 1.0 standard drink of alcohol    Types: 1 Cans of beer per week    Comment: daily, none in 2 weeks   Drug use: Yes    Types: Marijuana    Comment: last use marijuana 04-06-2020    No family history on file. Allergies  Allergen Reactions   Penicillins     hives   Vicodin [Hydrocodone-Acetaminophen]     n/v   Penicillins Rash      Patient Care Team: Ronnald Nian, MD as PCP - General (Family Medicine) Ronnald Nian, MD (Family Medicine)   Outpatient Medications Prior to Visit  Medication Sig   buPROPion (WELLBUTRIN SR) 150 MG 12 hr tablet TAKE ONE TABLET BY MOUTH TWICE A DAY   [DISCONTINUED] lisinopril-hydrochlorothiazide (ZESTORETIC) 20-12.5 MG tablet TAKE 1 TABLET BY MOUTH EVERY DAY FOR BLOOD PRESSURE   [DISCONTINUED] PARoxetine (PAXIL) 20 MG tablet Take 1 tablet (20 mg total) by mouth daily.   No facility-administered medications prior to visit.    Review of Systems  All other systems reviewed and are negative.         Objective:     BP 130/86   Pulse 67   Temp 97.7 F (36.5 C)   Ht 5' 0.5" (1.537 m)   Wt 152 lb 12.8 oz (69.3 kg)   SpO2 96%   BMI 29.35 kg/m  BP Readings from Last 3 Encounters:  06/22/22 130/86  03/02/21 (!) 150/100  10/12/20 116/80   Wt Readings from Last 3 Encounters:  06/22/22 152 lb 12.8 oz (69.3 kg)  03/02/21 154 lb 3.2 oz (69.9 kg)  11/16/20 154 lb (69.9 kg)      Physical Exam  Alert and in no distress. Tympanic membranes and canals are normal. Pharyngeal area is normal. Neck is supple without adenopathy or thyromegaly. Cardiac exam shows a regular sinus rhythm without murmurs or gallops. Lungs are clear to auscultation.  Hemoglobin A1c is 5.4  Results for orders placed or performed in visit on 06/22/22  POCT glycosylated hemoglobin (Hb A1C)  Result Value Ref Range   Hemoglobin A1C 5.3 4.0 - 5.6 %   HbA1c POC (<> result, manual entry)     HbA1c, POC (prediabetic range)     HbA1c, POC (controlled diabetic range)     Last CBC Lab Results  Component Value Date   WBC 9.4 09/02/2020   HGB 15.2 09/02/2020   HCT 45.8 09/02/2020   MCV 90 09/02/2020   MCH 30.0 09/02/2020   RDW 13.7 09/02/2020    PLT 265 09/02/2020   Last metabolic panel Lab Results  Component Value Date   GLUCOSE 108 (H) 09/02/2020   NA 136 09/02/2020   K 4.6 09/02/2020   CL 100 09/02/2020   CO2 27 09/02/2020   BUN 9 09/02/2020   CREATININE 0.74 (L) 09/02/2020   GFRNONAA 103 09/02/2020   CALCIUM 9.4 09/02/2020   PROT 6.9 09/02/2020   ALBUMIN 4.6 09/02/2020   LABGLOB 2.3 09/02/2020   AGRATIO 2.0 09/02/2020   BILITOT 0.4 09/02/2020   ALKPHOS 81 09/02/2020   AST 19 09/02/2020   ALT 15 09/02/2020   ANIONGAP 9 04/09/2020   Last lipids Lab Results  Component Value Date   CHOL 216 (H) 09/02/2020   HDL 63 09/02/2020   LDLCALC 140 (H) 09/02/2020   TRIG 75 09/02/2020   CHOLHDL 3.4 09/02/2020   Last hemoglobin A1c Lab Results  Component Value Date   HGBA1C 5.3 06/22/2022        Assessment & Plan:    Screening for lipid disorders - Plan: Lipid panel  Depression, unspecified depression type - Plan: PARoxetine (PAXIL) 20 MG tablet, DISCONTINUED: PARoxetine (PAXIL) 20 MG tablet  Essential hypertension - Plan: CBC with Differential/Platelet, Comprehensive metabolic panel, lisinopril-hydrochlorothiazide (ZESTORETIC) 20-12.5 MG tablet, DISCONTINUED: lisinopril-hydrochlorothiazide (ZESTORETIC) 20-12.5 MG tablet  Need for influenza vaccination - Plan: Flu Vaccine QUAD 6+ mos PF IM (Fluarix Quad PF)  Current smoker  Screening for colon cancer - Plan: Cologuard  Need for shingles vaccine - Plan: Varicella-zoster vaccine IM  Elevated glucose - Plan: POCT glycosylated hemoglobin (Hb A1C)  Immunization History  Administered Date(s) Administered   Influenza Split 07/25/2011, 07/09/2012   Influenza,inj,Quad PF,6+ Mos 06/16/2014, 07/23/2015, 10/06/2016, 09/02/2020, 06/22/2022   Tdap 10/06/2016   Zoster Recombinat (Shingrix) 06/22/2022    Health Maintenance  Topic Date Due   Fecal DNA (Cologuard)  04/18/2022   HIV Screening  06/23/2023 (Originally 05/28/1979)   Zoster Vaccines- Shingrix (2 of 2)  08/17/2022   TETANUS/TDAP  10/06/2026   INFLUENZA VACCINE  Completed   Hepatitis C Screening  Completed   HPV VACCINES  Aged Out  He will stop taking the Wellbutrin.  Continue on Paxil since it seems to be working for him.  Continue on the antibiotic.  Ardath Sax him to quit smoking however he is not interested in doing that.   Problem List Items Addressed This Visit  Current smoker   Depression   Relevant Medications   PARoxetine (PAXIL) 20 MG tablet   Other Visit Diagnoses     Screening for lipid disorders    -  Primary   Relevant Orders   Lipid panel   Essential hypertension       Relevant Medications   lisinopril-hydrochlorothiazide (ZESTORETIC) 20-12.5 MG tablet   Other Relevant Orders   CBC with Differential/Platelet   Comprehensive metabolic panel   Need for influenza vaccination       Relevant Orders   Flu Vaccine QUAD 6+ mos PF IM (Fluarix Quad PF) (Completed)   Screening for colon cancer       Relevant Orders   Cologuard   Need for shingles vaccine       Relevant Orders   Varicella-zoster vaccine IM (Completed)   Elevated glucose       Relevant Orders   POCT glycosylated hemoglobin (Hb A1C) (Completed)      Return in about 1 year (around 06/23/2023) for cpe .     Sharlot Gowda, MD

## 2022-06-22 NOTE — Patient Instructions (Signed)
Health Maintenance, Male Adopting a healthy lifestyle and getting preventive care are important in promoting health and wellness. Ask your health care provider about: The right schedule for you to have regular tests and exams. Things you can do on your own to prevent diseases and keep yourself healthy. What should I know about diet, weight, and exercise? Eat a healthy diet  Eat a diet that includes plenty of vegetables, fruits, low-fat dairy products, and lean protein. Do not eat a lot of foods that are high in solid fats, added sugars, or sodium. Maintain a healthy weight Body mass index (BMI) is a measurement that can be used to identify possible weight problems. It estimates body fat based on height and weight. Your health care provider can help determine your BMI and help you achieve or maintain a healthy weight. Get regular exercise Get regular exercise. This is one of the most important things you can do for your health. Most adults should: Exercise for at least 150 minutes each week. The exercise should increase your heart rate and make you sweat (moderate-intensity exercise). Do strengthening exercises at least twice a week. This is in addition to the moderate-intensity exercise. Spend less time sitting. Even light physical activity can be beneficial. Watch cholesterol and blood lipids Have your blood tested for lipids and cholesterol at 58 years of age, then have this test every 5 years. You may need to have your cholesterol levels checked more often if: Your lipid or cholesterol levels are high. You are older than 58 years of age. You are at high risk for heart disease. What should I know about cancer screening? Many types of cancers can be detected early and may often be prevented. Depending on your health history and family history, you may need to have cancer screening at various ages. This may include screening for: Colorectal cancer. Prostate cancer. Skin cancer. Lung  cancer. What should I know about heart disease, diabetes, and high blood pressure? Blood pressure and heart disease High blood pressure causes heart disease and increases the risk of stroke. This is more likely to develop in people who have high blood pressure readings or are overweight. Talk with your health care provider about your target blood pressure readings. Have your blood pressure checked: Every 3-5 years if you are 18-39 years of age. Every year if you are 40 years old or older. If you are between the ages of 65 and 75 and are a current or former smoker, ask your health care provider if you should have a one-time screening for abdominal aortic aneurysm (AAA). Diabetes Have regular diabetes screenings. This checks your fasting blood sugar level. Have the screening done: Once every three years after age 45 if you are at a normal weight and have a low risk for diabetes. More often and at a younger age if you are overweight or have a high risk for diabetes. What should I know about preventing infection? Hepatitis B If you have a higher risk for hepatitis B, you should be screened for this virus. Talk with your health care provider to find out if you are at risk for hepatitis B infection. Hepatitis C Blood testing is recommended for: Everyone born from 1945 through 1965. Anyone with known risk factors for hepatitis C. Sexually transmitted infections (STIs) You should be screened each year for STIs, including gonorrhea and chlamydia, if: You are sexually active and are younger than 58 years of age. You are older than 58 years of age and your   health care provider tells you that you are at risk for this type of infection. Your sexual activity has changed since you were last screened, and you are at increased risk for chlamydia or gonorrhea. Ask your health care provider if you are at risk. Ask your health care provider about whether you are at high risk for HIV. Your health care provider  may recommend a prescription medicine to help prevent HIV infection. If you choose to take medicine to prevent HIV, you should first get tested for HIV. You should then be tested every 3 months for as long as you are taking the medicine. Follow these instructions at home: Alcohol use Do not drink alcohol if your health care provider tells you not to drink. If you drink alcohol: Limit how much you have to 0-2 drinks a day. Know how much alcohol is in your drink. In the [place] one drink equals one 12 oz bottle of beer (355 mL), one 5 oz glass of wine (148 mL), or one 1 oz glass of hard liquor (44 mL). Lifestyle Do not use any products that contain nicotine or tobacco. These products include cigarettes, chewing tobacco, and vaping devices, such as e-cigarettes. If you need help quitting, ask your health care provider. Do not use street drugs. Do not share needles. Ask your health care provider for help if you need support or information about quitting drugs. General instructions Schedule regular health, dental, and eye exams. Stay current with your vaccines. Tell your health care provider if: You often feel depressed. You have ever been abused or do not feel safe at home. Summary Adopting a healthy lifestyle and getting preventive care are important in promoting health and wellness. Follow your health care provider's instructions about healthy diet, exercising, and getting tested or screened for diseases. Follow your health care provider's instructions on monitoring your cholesterol and blood pressure. This information is not intended to replace advice given to you by your health care provider. Make sure you discuss any questions you have with your health care provider. Document Revised: 03/01/2021 Document Reviewed: 03/01/2021 Elsevier Patient Education  2023 Elsevier Inc.  

## 2022-06-23 ENCOUNTER — Other Ambulatory Visit: Payer: Self-pay

## 2022-06-23 DIAGNOSIS — E875 Hyperkalemia: Secondary | ICD-10-CM

## 2022-06-23 LAB — LIPID PANEL
Chol/HDL Ratio: 3.5 ratio (ref 0.0–5.0)
Cholesterol, Total: 202 mg/dL — ABNORMAL HIGH (ref 100–199)
HDL: 57 mg/dL (ref >39)
LDL Chol Calc (NIH): 126 mg/dL — ABNORMAL HIGH (ref 0–99)
Triglycerides: 106 mg/dL (ref 0–149)
VLDL Cholesterol Cal: 19 mg/dL (ref 5–40)

## 2022-06-23 LAB — CBC WITH DIFFERENTIAL/PLATELET
Basophils Absolute: 0.1 10*3/uL (ref 0.0–0.2)
Basos: 1 %
EOS (ABSOLUTE): 0.2 10*3/uL (ref 0.0–0.4)
Eos: 3 %
Hematocrit: 41.5 % (ref 37.5–51.0)
Hemoglobin: 14.7 g/dL (ref 13.0–17.7)
Immature Grans (Abs): 0 10*3/uL (ref 0.0–0.1)
Immature Granulocytes: 0 %
Lymphocytes Absolute: 2.1 10*3/uL (ref 0.7–3.1)
Lymphs: 27 %
MCH: 31.6 pg (ref 26.6–33.0)
MCHC: 35.4 g/dL (ref 31.5–35.7)
MCV: 89 fL (ref 79–97)
Monocytes Absolute: 0.9 10*3/uL (ref 0.1–0.9)
Monocytes: 11 %
Neutrophils Absolute: 4.7 10*3/uL (ref 1.4–7.0)
Neutrophils: 58 %
Platelets: 244 10*3/uL (ref 150–450)
RBC: 4.65 x10E6/uL (ref 4.14–5.80)
RDW: 12.3 % (ref 11.6–15.4)
WBC: 8 10*3/uL (ref 3.4–10.8)

## 2022-06-23 LAB — COMPREHENSIVE METABOLIC PANEL
ALT: 13 IU/L (ref 0–44)
AST: 27 IU/L (ref 0–40)
Albumin/Globulin Ratio: 2.1 (ref 1.2–2.2)
Albumin: 4.5 g/dL (ref 3.8–4.9)
Alkaline Phosphatase: 60 IU/L (ref 44–121)
BUN/Creatinine Ratio: 17 (ref 9–20)
BUN: 14 mg/dL (ref 6–24)
Bilirubin Total: 0.4 mg/dL (ref 0.0–1.2)
CO2: 20 mmol/L (ref 20–29)
Calcium: 9.3 mg/dL (ref 8.7–10.2)
Chloride: 102 mmol/L (ref 96–106)
Creatinine, Ser: 0.83 mg/dL (ref 0.76–1.27)
Globulin, Total: 2.1 g/dL (ref 1.5–4.5)
Glucose: 85 mg/dL (ref 70–99)
Potassium: 5.8 mmol/L — ABNORMAL HIGH (ref 3.5–5.2)
Sodium: 140 mmol/L (ref 134–144)
Total Protein: 6.6 g/dL (ref 6.0–8.5)
eGFR: 101 mL/min/{1.73_m2} (ref >59)

## 2022-06-28 NOTE — Progress Notes (Signed)
Lvm with family for pt to call back to schedule  a lab visit. Per [employer] he needs a b-met . Section

## 2022-07-04 ENCOUNTER — Other Ambulatory Visit: Payer: Medicaid Other

## 2022-07-04 DIAGNOSIS — E875 Hyperkalemia: Secondary | ICD-10-CM | POA: Diagnosis not present

## 2022-07-04 LAB — BASIC METABOLIC PANEL
BUN/Creatinine Ratio: 11 (ref 9–20)
BUN: 9 mg/dL (ref 6–24)
CO2: 25 mmol/L (ref 20–29)
Calcium: 9.3 mg/dL (ref 8.7–10.2)
Chloride: 107 mmol/L — ABNORMAL HIGH (ref 96–106)
Creatinine, Ser: 0.85 mg/dL (ref 0.76–1.27)
Glucose: 98 mg/dL (ref 70–99)
Potassium: 5 mmol/L (ref 3.5–5.2)
Sodium: 145 mmol/L — ABNORMAL HIGH (ref 134–144)
eGFR: 101 mL/min/{1.73_m2} (ref >59)

## 2022-07-18 ENCOUNTER — Other Ambulatory Visit: Payer: Self-pay | Admitting: Family Medicine

## 2022-07-18 DIAGNOSIS — I10 Essential (primary) hypertension: Secondary | ICD-10-CM

## 2022-10-10 ENCOUNTER — Other Ambulatory Visit: Payer: Self-pay | Admitting: Family Medicine

## 2022-10-10 DIAGNOSIS — F32A Depression, unspecified: Secondary | ICD-10-CM

## 2023-04-19 ENCOUNTER — Other Ambulatory Visit: Payer: Self-pay | Admitting: Family Medicine

## 2023-04-19 DIAGNOSIS — F32A Depression, unspecified: Secondary | ICD-10-CM

## 2023-04-19 NOTE — Telephone Encounter (Signed)
Refill request last apt 06/22/22

## 2023-05-03 ENCOUNTER — Ambulatory Visit: Payer: Medicaid Other | Admitting: Family Medicine

## 2023-05-05 ENCOUNTER — Encounter: Payer: Self-pay | Admitting: Family Medicine

## 2023-05-10 ENCOUNTER — Ambulatory Visit: Payer: Medicaid Other | Admitting: Family Medicine

## 2023-05-10 ENCOUNTER — Encounter: Payer: Self-pay | Admitting: Family Medicine

## 2023-05-10 VITALS — BP 160/100 | HR 100 | Temp 98.0°F | Resp 20 | Wt 152.8 lb

## 2023-05-10 DIAGNOSIS — M545 Low back pain, unspecified: Secondary | ICD-10-CM | POA: Diagnosis not present

## 2023-05-10 NOTE — Progress Notes (Signed)
   Subjective:    Patient ID: Stephen Dudley, male    DOB: 27-Oct-1965, 57 y.o.   MRN: [id number]  HPI He is here for consult concerning continued difficulty with back pain that seems to be mainly in the right hip/back area.  Difficult to say how long this pain has been going on since some of the pain started on the left and then rotated over to the right.  It has been going on for well over a month.  He has been seen about 4 times in urgent care or in his emergency rooms for this.  He has had x-rays of the hip and spine.  He has been given prednisone as well as muscle relaxers.  He has been seen in late June, July 4, 10th and 15th for this.  On the 15th he was given a muscle relaxer and prednisone and states that he is feeling slightly better.  He was told that the x-rays show degenerative changes and possible spondylolisthesis. Also this is his second round of steroids and muscle relaxer.  Review of Systems     Objective:    Physical Exam Alert and complaining of right-sided back pain.  Full motion of the hip is noted.  No palpable tenderness.  Negative straight leg raising.  DTRs are normal.  No palpable abdominal pain noted. The ED note from July 10 was reviewed.      Assessment & Plan:   Acute right-sided low back pain without sciatica - Plan: MR Lumbar Spine Wo Contrast Since he has been seen at least 4 times in urgent care/ER and x-rays have been done on multiple occasions, it is time to do an MRI to further evaluate this.

## 2023-05-24 ENCOUNTER — Telehealth: Payer: Self-pay | Admitting: Family Medicine

## 2023-05-24 ENCOUNTER — Telehealth: Payer: Self-pay

## 2023-05-24 NOTE — Telephone Encounter (Signed)
Pt informed

## 2023-05-24 NOTE — Telephone Encounter (Signed)
Richard called regarding Stephen Dudley's MRI from Sunday. Told that MRI showed cancer on spine. Waiting to schedule more tests. Pt is asking for a replacement of pain meds, stomach is tense and tight. States paxil is not working and would like to be prescribed valium.

## 2023-05-24 NOTE — Telephone Encounter (Signed)
Robaxin 500 mg are not helping with pain anymore and pain has spread over larger area Wants pain rx, please advise    [facility]

## 2023-06-15 ENCOUNTER — Other Ambulatory Visit: Payer: Medicaid Other

## 2023-06-28 ENCOUNTER — Telehealth: Payer: Self-pay

## 2023-06-28 NOTE — Telephone Encounter (Signed)
Diane from physical therapy called to notify there was an attempted physical therapy assessment. However the pt was sick so it is rescheduled for Wednesday 07/05/23.

## 2023-06-29 ENCOUNTER — Ambulatory Visit: Payer: Medicaid Other | Admitting: Family Medicine

## 2023-06-30 ENCOUNTER — Telehealth: Payer: Self-pay | Admitting: Family Medicine

## 2023-06-30 NOTE — Telephone Encounter (Signed)
Richard called to inform you Stephen Dudley is in the hospital in [facility] and his calcium levels are very high, as well as he has been diagnosed with lung and bone cancer. He provided a call back number incase you want to call him back [phone].

## 2023-07-05 ENCOUNTER — Telehealth: Payer: Self-pay | Admitting: Family Medicine

## 2023-07-05 NOTE — Telephone Encounter (Signed)
Stephen Dudley at [facility] [phone]  Attempted PT assessment today but while on the way to the appt, they told her that they don't help

## 2023-07-06 ENCOUNTER — Telehealth: Payer: Self-pay | Admitting: Family Medicine

## 2023-07-06 NOTE — Telephone Encounter (Signed)
Representative called from [employer] to let us know Stephen Dudley has passed away and if Dr.Lalonde can sign the death certificate. He will be getting cremated.

## 2023-07-06 NOTE — Telephone Encounter (Signed)
Date of Death 07/05/23 11:40 am at home per [employer]

## 2023-07-06 NOTE — Telephone Encounter (Signed)
It is in [place] [facility] under your name.

## 2023-07-10 ENCOUNTER — Telehealth: Payer: Self-pay

## 2023-07-10 NOTE — Transitions of Care (Post Inpatient/ED Visit) (Signed)
07/10/2023  Name: Stephen Dudley MRN: [id number] DOB: 06/18/1966  Today's TOC FU Call Status: Today's TOC FU Call Status:: Unsuccessful Call (1st Attempt) Unsuccessful Call (1st Attempt) Date: 07/10/23  Attempted to reach the patient regarding the most recent Inpatient/ED visit.  Follow Up Plan: No further outreach attempts will be made at this time. We have been unable to contact the patient.  Was informed by patient's partner Gerlene Burdock that patient died last week, prior to palliative care follow up. Offered my condolence.  Alyse Low, RN, BA, [facility], CRRN [facility] Coordinator, Transition of Care Ph # [phone]

## 2023-07-27 ENCOUNTER — Other Ambulatory Visit: Payer: Self-pay | Admitting: Nurse Practitioner

## 2023-12-19 ENCOUNTER — Encounter: Payer: Self-pay | Admitting: Internal Medicine
